# Patient Record
Sex: Female | Born: 2000 | Race: Black or African American | Hispanic: No | Marital: Single | Smoking: Never smoker
Health system: Southern US, Community
[De-identification: ages and names within clinical notes are randomized; demographics above are authoritative.]

## PROBLEM LIST (undated history)

## (undated) ENCOUNTER — Inpatient Hospital Stay (HOSPITAL_COMMUNITY): Payer: Self-pay

## (undated) DIAGNOSIS — R072 Precordial pain: Secondary | ICD-10-CM

## (undated) DIAGNOSIS — F419 Anxiety disorder, unspecified: Secondary | ICD-10-CM

## (undated) DIAGNOSIS — R161 Splenomegaly, not elsewhere classified: Secondary | ICD-10-CM

## (undated) DIAGNOSIS — F32A Depression, unspecified: Secondary | ICD-10-CM

## (undated) DIAGNOSIS — B2790 Infectious mononucleosis, unspecified without complication: Secondary | ICD-10-CM

## (undated) DIAGNOSIS — F329 Major depressive disorder, single episode, unspecified: Secondary | ICD-10-CM

## (undated) HISTORY — PX: NO PAST SURGERIES: SHX2092

---
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
## 2020-01-22 ENCOUNTER — Other Ambulatory Visit: Payer: Self-pay | Admitting: Advanced Practice Midwife

## 2020-01-24 ENCOUNTER — Encounter (INDEPENDENT_AMBULATORY_CARE_PROVIDER_SITE_OTHER): Payer: Self-pay | Admitting: Neurology

## 2020-01-24 ENCOUNTER — Other Ambulatory Visit: Payer: Self-pay

## 2020-01-24 ENCOUNTER — Ambulatory Visit (INDEPENDENT_AMBULATORY_CARE_PROVIDER_SITE_OTHER): Payer: Medicaid Other | Admitting: Neurology

## 2020-01-24 VITALS — BP 118/78 | HR 80 | Ht <= 58 in | Wt 71.2 lb

## 2020-01-24 DIAGNOSIS — G44209 Tension-type headache, unspecified, not intractable: Secondary | ICD-10-CM

## 2020-01-24 DIAGNOSIS — F411 Generalized anxiety disorder: Secondary | ICD-10-CM | POA: Diagnosis not present

## 2020-01-24 DIAGNOSIS — G43009 Migraine without aura, not intractable, without status migrainosus: Secondary | ICD-10-CM | POA: Diagnosis not present

## 2020-01-24 MED ORDER — AMITRIPTYLINE HCL 25 MG PO TABS: ORAL_TABLET | ORAL | 5 refills | Status: DC

## 2020-01-24 NOTE — Progress Notes (Signed)
Patient: Jamie Moyer MRN: [id number] Sex: female DOB: [birth year]  Provider: Keturah Shavers, MD Location of Care: [facility]  Note type: Routine return visit  Referral Source: Ivory Broad, MD History from: patient, [facility] chart and mom Chief Complaint: headache  History of Present Illness: Jamie Moyer is a 19 y.o. female is here for follow-up management of headache.  She has diagnosis of migraine and tension type headaches as well as anxiety issues and some sensory symptoms of extremities for which she has been on low-dose amitriptyline with fairly good headache control.  She was last seen in April when she was pregnant and now she has her baby and since delivery the dose of amitriptyline increased from 12.5 mg every night to 25 mg every night. Over the past couple of months she has been doing very well in terms of headache intensity and frequency and she did not have more than 1 headache each month needed OTC medications. She usually sleeps well without any difficulty and she has not had any other issues such as vomiting or dizziness and overall doing well without any other complaints or concerns.  Review of Systems: Review of system as per HPI, otherwise negative.  Past Medical History:  Diagnosis Date  . Anxiety   . Depression   . Mononucleosis   . Precordial catch syndrome   . Spleen enlargement    Hospitalizations: No., Head Injury: No., Nervous System Infections: No., Immunizations up to date: Yes.     Surgical History Past Surgical History:  Procedure Laterality Date  . CESAREAN SECTION N/A 09/05/2019   Procedure: CESAREAN SECTION;  Surgeon: Allie Bossier, MD;  Location: [facility];  Service: Obstetrics;  Laterality: N/A;  . LAPAROSCOPIC APPENDECTOMY N/A 12/24/2019   Procedure: APPENDECTOMY LAPAROSCOPIC;  Surgeon: Axel Filler, MD;  Location: [facility] OR;  Service: General;  Laterality: N/A;  . NO PAST SURGERIES      Family History family history  includes Diabetes in an other family member; Healthy in her father; Heart disease in an other family member; High blood pressure in her mother; Migraines in her mother; Panic disorder in her mother.   Social History Social History   Socioeconomic History  . Marital status: Single    Spouse name: Not on file  . Number of children: Not on file  . Years of education: Not on file  . Highest education level: High school graduate  Occupational History  . Occupation: Corporate investment banker  Tobacco Use  . Smoking status: Passive Smoke Exposure - Never Smoker  . Smokeless tobacco: Never Used  Vaping Use  . Vaping Use: Never used  Substance and Sexual Activity  . Alcohol use: No  . Drug use: No  . Sexual activity: Not Currently    Birth control/protection: None  Other Topics Concern  . 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   Lives with her mother. She has adult siblings that do no live in the home.    Social Determinants of Health   Financial Resource Strain: Unknown  . Difficulty of Paying Living Expenses: Patient refused  Food Insecurity: Unknown  . Worried About Programme researcher, broadcasting/film/video in the Last Year: Patient refused  . Ran Out of Food in the Last Year: Patient refused  Transportation Needs: Unknown  . Lack of Transportation (Medical): Patient refused  . Lack of Transportation (Non-Medical): Patient refused  Physical Activity: Unknown  .  Days of Exercise per Week: Patient refused  . Minutes of Exercise per Session: Patient refused  Stress: Unknown  . Feeling of Stress : Patient refused  Social Connections: Unknown  . Frequency of Communication with Friends and Family: Patient refused  . Frequency of Social Gatherings with Friends and Family: Patient refused  . Attends Religious Services: Patient refused  . Active Member of Clubs or Organizations: Patient refused  . Attends Banker Meetings: Patient refused   . Marital Status: Patient refused     Allergies  Allergen Reactions  . Other     Seasonal Allergies    . Cephalexin Rash    Physical Exam BP 118/78   Pulse 80   Ht 4' 9.87" (1.47 m)   Wt 71 lb 3.3 oz (32.3 kg)   BMI 14.95 kg/m  Gen: Awake, alert, not in distress, Non-toxic appearance. Skin: No neurocutaneous stigmata, no rash HEENT: Normocephalic, no dysmorphic features, no conjunctival injection, nares patent, mucous membranes moist, oropharynx clear. Neck: Supple, no meningismus, no lymphadenopathy,  Resp: Clear to auscultation bilaterally CV: Regular rate, normal S1/S2, no murmurs, no rubs Abd: Bowel sounds present, abdomen soft, non-tender, non-distended.  No hepatosplenomegaly or mass. Ext: Warm and well-perfused. No deformity, no muscle wasting, ROM full.  Neurological Examination: MS- Awake, alert, interactive Cranial Nerves- Pupils equal, round and reactive to light (5 to 82mm); fix and follows with full and smooth EOM; no nystagmus; no ptosis, funduscopy with normal sharp discs, visual field full by looking at the toys on the side, face symmetric with smile.  Hearing intact to bell bilaterally, palate elevation is symmetric, and tongue protrusion is symmetric. Tone- Normal Strength-Seems to have good strength, symmetrically by observation and passive movement. Reflexes-    Biceps Triceps Brachioradialis Patellar Ankle  R 2+ 2+ 2+ 2+ 2+  L 2+ 2+ 2+ 2+ 2+   Plantar responses flexor bilaterally, no clonus noted Sensation- Withdraw at four limbs to stimuli. Coordination- Reached to the object with no dysmetria Gait: Normal walk without any coordination or balance issues.   Assessment and Plan 1. Migraine without aura and without status migrainosus, not intractable   2. Anxiety state   3. Tension headache    This is a 19 year old female with diagnosis of migraine and tension type headaches as well has some anxiety issues, recently her baby girl and doing well  after without having any significant headaches and tolerating medication well with no side effects. Recommend to continue the same dose of amitriptyline at 25 mg every night which would help with headache, anxiety issues and sleep through the night. She will continue with more hydration with adequate sleep and limited screen time. She also needs to continue with dietary supplements and vitamins since she is significantly underweight and not eating well. She will call my office if she develops more frequent headaches otherwise I would like to see her in 6 months for follow-up visit and adjusting the dose of medication if needed.  She and her mother understood and agreed with the plan.   Meds ordered this encounter  Medications  . amitriptyline (ELAVIL) 25 MG tablet    Sig: Take 1 tablet nightly    Dispense:  30 tablet    Refill:  5

## 2020-01-24 NOTE — Patient Instructions (Signed)
Continue the same dose of amitriptyline every night Continue drinking more water Continue taking dietary supplements and multivitamin Have adequate sleep Return in 6 months for follow-up visit

## 2020-03-11 ENCOUNTER — Other Ambulatory Visit: Payer: Self-pay

## 2020-03-11 ENCOUNTER — Ambulatory Visit (HOSPITAL_COMMUNITY)
Admission: EM | Admit: 2020-03-11 | Discharge: 2020-03-11 | Disposition: A | Discharge status: Home / Self Care | Payer: Medicaid Other | Attending: Family Medicine | Admitting: Family Medicine

## 2020-03-11 ENCOUNTER — Encounter (HOSPITAL_COMMUNITY): Payer: Self-pay

## 2020-03-11 DIAGNOSIS — Z20822 Contact with and (suspected) exposure to covid-19: Secondary | ICD-10-CM | POA: Insufficient documentation

## 2020-03-11 LAB — SARS CORONAVIRUS 2 (TAT 6-24 HRS): SARS Coronavirus 2: POSITIVE — AB

## 2020-03-11 MED ORDER — BENZONATATE 100 MG PO CAPS
100.0000 mg | ORAL_CAPSULE | Freq: Three times a day (TID) | ORAL | 0 refills | Status: DC
Start: 2020-03-11 — End: 2020-09-12

## 2020-03-11 MED ORDER — CEPACOL SORE THROAT 5.4 MG MT LOZG
1.0000 | LOZENGE | OROMUCOSAL | 0 refills | Status: DC | PRN
Start: 2020-03-11 — End: 2020-09-12

## 2020-03-11 NOTE — ED Provider Notes (Signed)
[facility]    CSN: [id number] Arrival date & time: 03/11/20  1120      History   Chief Complaint Chief Complaint  Patient presents with  . Cough    HPI Jamie Moyer is a 19 y.o. female.   Patient reports for Covid testing due to close Covid exposure and developing symptoms of fatigue, slight cough and scratchy sore throat.  Her 56-month-old tested positive for Covid on eight twenty-two.  Few days prior to her 61-month-old testing positive patient had a slight scratchy throat that had mostly resolved.  Over the last few days she has developed a slight nonproductive cough and some general fatigue.  Denies fevers and chills.  Denies chest pain or shortness of breath.  Has been eating and drinking well.  No headaches.  No abdominal pain.  No nausea, vomiting or diarrhea.  She has been drinking warm teas to help with her cough.  She is not breast-feeding the infant.     Past Medical History:  Diagnosis Date  . Anxiety   . Depression   . Mononucleosis   . Precordial catch syndrome   . Spleen enlargement     Patient Active Problem List   Diagnosis Date Noted  . Appendicitis 12/24/2019  . Fetal heart rate decelerations affecting management of mother 09/04/2019  . Marginal insertion of umbilical cord affecting management of mother in third trimester 09/04/2019  . Gestational hypertension, third trimester 08/30/2019  . Episode of hypertension 07/25/2019  . Generalized anxiety disorder 04/25/2019  . Supervision of normal first pregnancy, antepartum 03/20/2019  . Tension headache 11/17/2015  . Anxiety state 11/17/2015  . Migraine without aura and without status migrainosus, not intractable 11/17/2015  . Poor appetite 11/17/2015  . Insomnia 11/17/2015    Past Surgical History:  Procedure Laterality Date  . CESAREAN SECTION N/A 09/05/2019   Procedure: CESAREAN SECTION;  Surgeon: Allie Bossier, MD;  Location: [facility];  Service: Obstetrics;  Laterality: N/A;  .  LAPAROSCOPIC APPENDECTOMY N/A 12/24/2019   Procedure: APPENDECTOMY LAPAROSCOPIC;  Surgeon: Axel Filler, MD;  Location: [facility] OR;  Service: General;  Laterality: N/A;  . NO PAST SURGERIES      OB History    Gravida  1   Para  1   Term      Preterm  1   AB      Living  1     SAB      TAB      Ectopic      Multiple  0   Live Births  1            Home Medications    Prior to Admission medications   Medication Sig Start Date End Date Taking? Authorizing Provider  acetaminophen (TYLENOL) 325 MG tablet Take 2 tablets (650 mg total) by mouth every 6 (six) hours as needed for mild pain (or temp > 100). 12/24/19   Maczis, Elmer Sow, PA-C  amitriptyline (ELAVIL) 25 MG tablet Take 1 tablet nightly 01/24/20   Keturah Shavers, MD  benzonatate (TESSALON) 100 MG capsule Take 1 capsule (100 mg total) by mouth every 8 (eight) hours. 03/11/20   Jerame Hedding, Veryl Speak, PA-C  busPIRone (BUSPAR) 5 MG tablet Take 2.5 mg by mouth daily. 07/30/19   [provider]  Ferrous Fumarate (HEMOCYTE - 106 MG FE) 324 (106 Fe) MG TABS tablet Take 1 tablet (106 mg of iron total) by mouth every other day. 09/08/19   Raelyn Mora, CNM  fluticasone (FLONASE) 50 MCG/ACT nasal spray Place 1 spray into both nostrils daily as needed for allergies.  11/07/15   [provider]  hydrOXYzine (ATARAX/VISTARIL) 10 MG tablet take 1 tablet by mouth twice a day if needed FOR ACUTE ANXIETY 07/28/16   [provider]  ibuprofen (ADVIL) 800 MG tablet Take 1 tablet (800 mg total) by mouth every 8 (eight) hours. 09/08/19   Raelyn Mora, CNM  loratadine (CLARITIN) 10 MG tablet Take 10 mg by mouth daily as needed for allergies.  11/07/15   [provider]  Menthol (CEPACOL SORE THROAT) 5.4 MG LOZG Use as directed 1 lozenge (5.4 mg total) in the mouth or throat every 2 (two) hours as needed. 03/11/20   Richmond Coldren, Veryl Speak, PA-C  Olopatadine HCl 0.2 % SOLN Place 1 drop into both eyes daily as needed (For  allergies).  09/11/19   [provider]  ondansetron (ZOFRAN) 4 MG tablet Take 4 mg by mouth every 6 (six) hours as needed. 12/28/19   [provider]  oxyCODONE (OXY IR/ROXICODONE) 5 MG immediate release tablet Take 1 tablet (5 mg total) by mouth every 6 (six) hours as needed for severe pain. 12/24/19   Maczis, Elmer Sow, PA-C  Prenatal MV & Min w/FA-DHA (PRENATAL ADULT GUMMY/DHA/FA) 0.4-25 MG CHEW Chew 1 Units by mouth daily. Patient not taking: Reported on 12/23/2019 07/25/19   Gerrit Heck, CNM  vitamin C (VITAMIN C) 500 MG tablet Take 1 tablet (500 mg total) by mouth every other day. 09/08/19   Raelyn Mora, CNM  amLODipine (NORVASC) 5 MG tablet Take 1 tablet (5 mg total) by mouth daily. 09/20/19 12/20/19  Raelyn Mora, CNM    Family History Family History  Problem Relation Age of Onset  . Migraines Mother   . Panic disorder Mother   . High blood pressure Mother   . Healthy Father   . Diabetes Other   . Heart disease Other     Social History Social History   Tobacco Use  . Smoking status: Passive Smoke Exposure - Never Smoker  . Smokeless tobacco: Never Used  Vaping Use  . Vaping Use: Never used  Substance Use Topics  . Alcohol use: No  . Drug use: No     Allergies   Other and Cephalexin   Review of Systems Review of Systems   Physical Exam Triage Vital Signs ED Triage Vitals  Enc Vitals Group     BP 03/11/20 1341 92/64     Pulse Rate 03/11/20 1341 (!) 117     Resp 03/11/20 1341 16     Temp 03/11/20 1341 98.1 F (36.7 C)     Temp Source 03/11/20 1341 Oral     SpO2 03/11/20 1341 99 %     Weight --      Height --      Head Circumference --      Peak Flow --      Pain Score 03/11/20 1340 0     Pain Loc --      Pain Edu? --      Excl. in GC? --    No data found.  Updated Vital Signs BP 92/64 (BP Location: Right Arm)   Pulse (!) 117   Temp 98.1 F (36.7 C) (Oral)   Resp 16   LMP 03/11/2020   SpO2 99%   Breastfeeding No   Visual  Acuity Right Eye Distance:   Left Eye Distance:   Bilateral Distance:    Right  Eye Near:   Left Eye Near:    Bilateral Near:     Physical Exam Vitals and nursing note reviewed.  Constitutional:      General: She is not in acute distress.    Appearance: She is well-developed. She is not ill-appearing.  HENT:     Head: Normocephalic and atraumatic.     Nose: Congestion present.  Eyes:     Conjunctiva/sclera: Conjunctivae normal.  Cardiovascular:     Rate and Rhythm: Regular rhythm. Tachycardia present.     Heart sounds: No murmur heard.   Pulmonary:     Effort: Pulmonary effort is normal. No respiratory distress.     Breath sounds: Normal breath sounds. No wheezing, rhonchi or rales.  Abdominal:     Palpations: Abdomen is soft.     Tenderness: There is no abdominal tenderness.  Musculoskeletal:     Cervical back: Neck supple.  Skin:    General: Skin is warm and dry.  Neurological:     Mental Status: She is alert.      UC Treatments / Results  Labs (all labs ordered are listed, but only abnormal results are displayed) Labs Reviewed  SARS CORONAVIRUS 2 (TAT 6-24 HRS) - Abnormal; Notable for the following components:      Result Value   SARS Coronavirus 2 POSITIVE (*)    All other components within normal limits    EKG   Radiology No results found.  Procedures Procedures (including critical care time)  Medications Ordered in UC Medications - No data to display  Initial Impression / Assessment and Plan / UC Course  I have reviewed the triage vital signs and the nursing notes.  Pertinent labs & imaging results that were available during my care of the patient were reviewed by me and considered in my medical decision making (see chart for details).     #Suspected COVID-19 virus #Close exposure to Covid Patient is a 19 year old presenting with what is most likely COVID-19 driven upper respiratory symptoms.  Slightly elevated heart rate, otherwise  reassuring and benign exam.  Well-appearing in clinic.  Covid sent.  Will treat symptomatically.  Return, follow-up and emergency for precautions discussed.  Patient verbalized understanding plan of care. Final Clinical Impressions(s) / UC Diagnoses   Final diagnoses:  Suspected COVID-19 virus infection  Close exposure to COVID-19 virus     Discharge Instructions     I am fairly certain that you likely have Covid at this point given close exposures.  Take medications as prescribed  If severe symptoms such as shortness of breath, vomiting, high fevers return or go to the emergency department  If your Covid-19 test is positive, you will receive a phone call from [facility] regarding your results. Negative test results are not called. Both positive and negative results area always visible on MyChart. If you do not have a MyChart account, sign up instructions are in your discharge papers.   Persons who are directed to care for themselves at home may discontinue isolation under the following conditions:  . At least 10 days have passed since symptom onset and . At least 24 hours have passed without running a fever (this means without the use of fever-reducing medications) and . Other symptoms have improved.  Persons infected with COVID-19 who never develop symptoms may discontinue isolation and other precautions 10 days after the date of their first positive COVID-19 test.       ED Prescriptions    Medication Sig Dispense Auth. Provider  benzonatate (TESSALON) 100 MG capsule Take 1 capsule (100 mg total) by mouth every 8 (eight) hours. 21 capsule Jadin Creque, Veryl SpeakJacob E, PA-C   Menthol (CEPACOL SORE THROAT) 5.4 MG LOZG Use as directed 1 lozenge (5.4 mg total) in the mouth or throat every 2 (two) hours as needed. 30 lozenge Kross Swallows, Veryl SpeakJacob E, PA-C     PDMP not reviewed this encounter.   Hermelinda Medicusarr, Lorali Khamis E, PA-C 03/12/20 [phone]

## 2020-03-11 NOTE — ED Triage Notes (Signed)
Pt is here with a cough that started 3 days ago, pt has drunk tea to relieve discomfort. Pt's daughter tested POSITIVE for COVID 8/22.

## 2020-03-11 NOTE — Discharge Instructions (Signed)
I am fairly certain that you likely have Covid at this point given close exposures.  Take medications as prescribed  If severe symptoms such as shortness of breath, vomiting, high fevers return or go to the emergency department  If your Covid-19 test is positive, you will receive a phone call from [facility] regarding your results. Negative test results are not called. Both positive and negative results area always visible on MyChart. If you do not have a MyChart account, sign up instructions are in your discharge papers.   Persons who are directed to care for themselves at home may discontinue isolation under the following conditions:   At least 10 days have passed since symptom onset and  At least 24 hours have passed without running a fever (this means without the use of fever-reducing medications) and  Other symptoms have improved.  Persons infected with COVID-19 who never develop symptoms may discontinue isolation and other precautions 10 days after the date of their first positive COVID-19 test.

## 2020-03-12 ENCOUNTER — Telehealth: Payer: Self-pay | Admitting: Emergency Medicine

## 2020-03-12 NOTE — Telephone Encounter (Signed)
Spoke with pt given test results; verbalized understanding of positive covid screening

## 2020-04-30 ENCOUNTER — Other Ambulatory Visit (INDEPENDENT_AMBULATORY_CARE_PROVIDER_SITE_OTHER): Payer: Self-pay

## 2020-05-04 ENCOUNTER — Encounter (HOSPITAL_COMMUNITY): Payer: Self-pay | Admitting: Emergency Medicine

## 2020-05-04 ENCOUNTER — Ambulatory Visit (HOSPITAL_COMMUNITY)
Admission: EM | Admit: 2020-05-04 | Discharge: 2020-05-04 | Disposition: A | Discharge status: Home / Self Care | Payer: Medicaid Other | Attending: Emergency Medicine | Admitting: Emergency Medicine

## 2020-05-04 DIAGNOSIS — R300 Dysuria: Secondary | ICD-10-CM

## 2020-05-04 DIAGNOSIS — M549 Dorsalgia, unspecified: Secondary | ICD-10-CM

## 2020-05-04 DIAGNOSIS — R000 Tachycardia, unspecified: Secondary | ICD-10-CM | POA: Diagnosis present

## 2020-05-04 DIAGNOSIS — R109 Unspecified abdominal pain: Secondary | ICD-10-CM | POA: Diagnosis present

## 2020-05-04 DIAGNOSIS — Z3202 Encounter for pregnancy test, result negative: Secondary | ICD-10-CM | POA: Diagnosis not present

## 2020-05-04 LAB — POCT URINALYSIS DIPSTICK, ED / UC
Bilirubin Urine: NEGATIVE
Glucose, UA: NEGATIVE mg/dL
Ketones, ur: NEGATIVE mg/dL
Nitrite: NEGATIVE
Protein, ur: NEGATIVE mg/dL
Specific Gravity, Urine: 1.015 (ref 1.005–1.030)
Urobilinogen, UA: 0.2 mg/dL (ref 0.0–1.0)
pH: 7 (ref 5.0–8.0)

## 2020-05-04 LAB — CBC WITH DIFFERENTIAL/PLATELET
Abs Immature Granulocytes: 0.02 10*3/uL (ref 0.00–0.07)
Basophils Absolute: 0.1 10*3/uL (ref 0.0–0.1)
Basophils Relative: 1 %
Eosinophils Absolute: 0 10*3/uL (ref 0.0–0.5)
Eosinophils Relative: 0 %
HCT: 41.7 % (ref 36.0–46.0)
Hemoglobin: 14.1 g/dL (ref 12.0–15.0)
Immature Granulocytes: 0 %
Lymphocytes Relative: 26 %
Lymphs Abs: 2.1 10*3/uL (ref 0.7–4.0)
MCH: 29.6 pg (ref 26.0–34.0)
MCHC: 33.8 g/dL (ref 30.0–36.0)
MCV: 87.4 fL (ref 80.0–100.0)
Monocytes Absolute: 0.5 10*3/uL (ref 0.1–1.0)
Monocytes Relative: 7 %
Neutro Abs: 5.3 10*3/uL (ref 1.7–7.7)
Neutrophils Relative %: 66 %
Platelets: 294 10*3/uL (ref 150–400)
RBC: 4.77 MIL/uL (ref 3.87–5.11)
RDW: 11.5 % (ref 11.5–15.5)
WBC: 8 10*3/uL (ref 4.0–10.5)
nRBC: 0 % (ref 0.0–0.2)

## 2020-05-04 LAB — COMPREHENSIVE METABOLIC PANEL
ALT: 16 U/L (ref 0–44)
AST: 16 U/L (ref 15–41)
Albumin: 4.2 g/dL (ref 3.5–5.0)
Alkaline Phosphatase: 70 U/L (ref 38–126)
Anion gap: 10 (ref 5–15)
BUN: 11 mg/dL (ref 6–20)
CO2: 22 mmol/L (ref 22–32)
Calcium: 9.4 mg/dL (ref 8.9–10.3)
Chloride: 104 mmol/L (ref 98–111)
Creatinine, Ser: 0.61 mg/dL (ref 0.44–1.00)
GFR, Estimated: >60 mL/min (ref >60)
Glucose, Bld: 91 mg/dL (ref 70–99)
Potassium: 3.9 mmol/L (ref 3.5–5.1)
Sodium: 136 mmol/L (ref 135–145)
Total Bilirubin: 1.1 mg/dL (ref 0.3–1.2)
Total Protein: 7.7 g/dL (ref 6.5–8.1)

## 2020-05-04 LAB — LIPASE, BLOOD: Lipase: 28 U/L (ref 11–51)

## 2020-05-04 LAB — POC URINE PREG, ED: Preg Test, Ur: NEGATIVE

## 2020-05-04 MED ORDER — IBUPROFEN 800 MG PO TABS: 800.0000 mg | ORAL_TABLET | Freq: Three times a day (TID) | ORAL | 0 refills | Status: DC | PRN

## 2020-05-04 MED ORDER — NITROFURANTOIN MONOHYD MACRO 100 MG PO CAPS: 100.0000 mg | ORAL_CAPSULE | Freq: Two times a day (BID) | ORAL | 0 refills | Status: DC

## 2020-05-04 MED ORDER — SULFAMETHOXAZOLE-TRIMETHOPRIM 800-160 MG PO TABS: 1.0000 | ORAL_TABLET | Freq: Two times a day (BID) | ORAL | 0 refills | Status: AC

## 2020-05-04 MED ORDER — KETOROLAC TROMETHAMINE 60 MG/2ML IM SOLN
60.0000 mg | Freq: Once | INTRAMUSCULAR | Status: AC
  Administered 2020-05-04: 60 mg via INTRAMUSCULAR

## 2020-05-04 MED ORDER — KETOROLAC TROMETHAMINE 60 MG/2ML IM SOLN
INTRAMUSCULAR | Status: AC
  Filled 2020-05-04: qty 2

## 2020-05-04 NOTE — ED Triage Notes (Signed)
Pt presents with low back/ side pain, dysuria, and clowdy urine xs 3-4 days.

## 2020-05-04 NOTE — ED Provider Notes (Signed)
[facility]    CSN: [id number] Arrival date & time: 05/04/20  1503      History   Chief Complaint Chief Complaint  Patient presents with  . Back Pain  . Dysuria    HPI Jamie Moyer is a 19 y.o. female.   Jamie Moyer presents with complaints of urinary symptoms. Right side pain. Pain with urination. Urine is cloudy. Symptoms started 3-4 days ago. No known fevers. Urinary frequecy. Some urgency. No vaginal symptoms. Some back pain. No history of kidney stones. Was treated for UTI in April, negative culture at that time, but felt better after antibtioics for UTI- macrobid. . Had her appendix out in June of this year. Not menstruating. Hasn't seen blood to urine.     ROS per HPI, negative if not otherwise mentioned.      Past Medical History:  Diagnosis Date  . Anxiety   . Depression   . Mononucleosis   . Precordial catch syndrome   . Spleen enlargement     Patient Active Problem List   Diagnosis Date Noted  . Appendicitis 12/24/2019  . Fetal heart rate decelerations affecting management of mother 09/04/2019  . Marginal insertion of umbilical cord affecting management of mother in third trimester 09/04/2019  . Gestational hypertension, third trimester 08/30/2019  . Episode of hypertension 07/25/2019  . Generalized anxiety disorder 04/25/2019  . Supervision of normal first pregnancy, antepartum 03/20/2019  . Tension headache 11/17/2015  . Anxiety state 11/17/2015  . Migraine without aura and without status migrainosus, not intractable 11/17/2015  . Poor appetite 11/17/2015  . Insomnia 11/17/2015    Past Surgical History:  Procedure Laterality Date  . CESAREAN SECTION N/A 09/05/2019   Procedure: CESAREAN SECTION;  Surgeon: Allie Bossier, MD;  Location: [facility];  Service: Obstetrics;  Laterality: N/A;  . LAPAROSCOPIC APPENDECTOMY N/A 12/24/2019   Procedure: APPENDECTOMY LAPAROSCOPIC;  Surgeon: Axel Filler, MD;  Location: [facility];  Service:  General;  Laterality: N/A;  . NO PAST SURGERIES      OB History    Gravida  1   Para  1   Term      Preterm  1   AB      Living  1     SAB      TAB      Ectopic      Multiple  0   Live Births  1            Home Medications    Prior to Admission medications   Medication Sig Start Date End Date Taking? Authorizing Provider  acetaminophen (TYLENOL) 325 MG tablet Take 2 tablets (650 mg total) by mouth every 6 (six) hours as needed for mild pain (or temp > 100). 12/24/19   Maczis, Elmer Sow, PA-C  amitriptyline (ELAVIL) 25 MG tablet Take 1 tablet nightly 01/24/20   Keturah Shavers, MD  benzonatate (TESSALON) 100 MG capsule Take 1 capsule (100 mg total) by mouth every 8 (eight) hours. 03/11/20   Darr, Veryl Speak, PA-C  busPIRone (BUSPAR) 5 MG tablet Take 2.5 mg by mouth daily. 07/30/19   [provider]  Ferrous Fumarate (HEMOCYTE - 106 MG FE) 324 (106 Fe) MG TABS tablet Take 1 tablet (106 mg of iron total) by mouth every other day. 09/08/19   Raelyn Mora, CNM  fluticasone (FLONASE) 50 MCG/ACT nasal spray Place 1 spray into both nostrils daily as needed for allergies.  11/07/15   [provider]  hydrOXYzine (ATARAX/VISTARIL) 10 MG tablet take 1 tablet by mouth twice a day if needed FOR ACUTE ANXIETY 07/28/16   [provider]  ibuprofen (ADVIL) 800 MG tablet Take 1 tablet (800 mg total) by mouth every 8 (eight) hours as needed. 05/04/20   Georgetta HaberBurky, Leoni Goodness B, NP  loratadine (CLARITIN) 10 MG tablet Take 10 mg by mouth daily as needed for allergies.  11/07/15   [provider]  Menthol (CEPACOL SORE THROAT) 5.4 MG LOZG Use as directed 1 lozenge (5.4 mg total) in the mouth or throat every 2 (two) hours as needed. 03/11/20   Darr, Veryl SpeakJacob E, PA-C  Olopatadine HCl 0.2 % SOLN Place 1 drop into both eyes daily as needed (For allergies).  09/11/19   [provider]  ondansetron (ZOFRAN) 4 MG tablet Take 4 mg by mouth every 6 (six) hours as needed.  12/28/19   [provider]  oxyCODONE (OXY IR/ROXICODONE) 5 MG immediate release tablet Take 1 tablet (5 mg total) by mouth every 6 (six) hours as needed for severe pain. 12/24/19   Maczis, Elmer SowMichael M, PA-C  Prenatal MV & Min w/FA-DHA (PRENATAL ADULT GUMMY/DHA/FA) 0.4-25 MG CHEW Chew 1 Units by mouth daily. Patient not taking: Reported on 12/23/2019 07/25/19   Gerrit HeckEmly, Jessica, CNM  sulfamethoxazole-trimethoprim (BACTRIM DS) 800-160 MG tablet Take 1 tablet by mouth 2 (two) times daily for 10 days. 05/04/20 05/14/20  Georgetta HaberBurky, Tyree Vandruff B, NP  vitamin C (VITAMIN C) 500 MG tablet Take 1 tablet (500 mg total) by mouth every other day. 09/08/19   Raelyn Moraawson, Rolitta, CNM  amLODipine (NORVASC) 5 MG tablet Take 1 tablet (5 mg total) by mouth daily. 09/20/19 12/20/19  Raelyn Moraawson, Rolitta, CNM    Family History Family History  Problem Relation Age of Onset  . Migraines Mother   . Panic disorder Mother   . High blood pressure Mother   . Healthy Father   . Diabetes Other   . Heart disease Other     Social History Social History   Tobacco Use  . Smoking status: Passive Smoke Exposure - Never Smoker  . Smokeless tobacco: Never Used  Vaping Use  . Vaping Use: Never used  Substance Use Topics  . Alcohol use: No  . Drug use: No     Allergies   Other and Cephalexin   Review of Systems Review of Systems   Physical Exam Triage Vital Signs ED Triage Vitals  Enc Vitals Group     BP 05/04/20 1534 (!) 138/95     Pulse Rate 05/04/20 1534 (!) 121     Resp 05/04/20 1534 16     Temp 05/04/20 1534 (!) 97.2 F (36.2 C)     Temp Source 05/04/20 1534 Oral     SpO2 05/04/20 1534 99 %     Weight --      Height --      Head Circumference --      Peak Flow --      Pain Score 05/04/20 1533 6     Pain Loc --      Pain Edu? --      Excl. in GC? --    No data found.  Updated Vital Signs BP (!) 138/95 (BP Location: Right Arm)   Pulse (!) 121   Temp (!) 97.2 F (36.2 C) (Oral)   Resp 16   LMP 04/14/2020    SpO2 99%   Visual Acuity Right Eye Distance:   Left Eye Distance:   Bilateral Distance:  Right Eye Near:   Left Eye Near:    Bilateral Near:     Physical Exam Constitutional:      General: She is not in acute distress.    Appearance: She is well-developed.  Cardiovascular:     Rate and Rhythm: Tachycardia present.     Comments: Tachycardia worsens with examination with apparent discomfort  Pulmonary:     Effort: Pulmonary effort is normal.  Abdominal:     Tenderness: There is abdominal tenderness in the right upper quadrant. There is right CVA tenderness.     Comments: Right flank and RUQ abdominal tenderness   Skin:    General: Skin is warm and dry.  Neurological:     Mental Status: She is alert and oriented to person, place, and time.    With exam HR up to 140.   UC Treatments / Results  Labs (all labs ordered are listed, but only abnormal results are displayed) Labs Reviewed  POCT URINALYSIS DIPSTICK, ED / UC - Abnormal; Notable for the following components:      Result Value   Hgb urine dipstick MODERATE (*)    Leukocytes,Ua LARGE (*)    All other components within normal limits  URINE CULTURE  CBC WITH DIFFERENTIAL/PLATELET  COMPREHENSIVE METABOLIC PANEL  LIPASE, BLOOD  POC URINE PREG, ED    EKG   Radiology No results found.  Procedures Procedures (including critical care time)  Medications Ordered in UC Medications  ketorolac (TORADOL) injection 60 mg (60 mg Intramuscular Given 05/04/20 1657)    Initial Impression / Assessment and Plan / UC Course  I have reviewed the triage vital signs and the nursing notes.  Pertinent labs & imaging results that were available during my care of the patient were reviewed by me and considered in my medical decision making (see chart for details).    Patient with tachycardia, she expresses she gets anxious and heart rate elevates. On chart review with last episode of heart rate this hight, up to 140, she had  an appendicitis. Flank pain, leukos and hgb to urine. Kidney stone vs pyelonephritis vs uti vs cholecystitis all considered. Patient declines IV fluids to see if this helps with HR. Pain mildly improved with toradol in clinic. Antibiotics initiated at this time pending urine culture with strict return precautions. Patient verbalized understanding and agreeable to plan. Ambulatory out of clinic without difficulty.   Final Clinical Impressions(s) / UC Diagnoses   Final diagnoses:  Flank pain  Tachycardia     Discharge Instructions     Ibuprofen every 8 hours as needed for pain.  Please start bactrim, antibiotics, pending lab results of urine culture and blood testing.  We will call you if there are any concerning findings with your blood testing.  If no improvement in the next 48 hours, or if worsening please go to the ER.     ED Prescriptions    Medication Sig Dispense Auth. Provider   nitrofurantoin, macrocrystal-monohydrate, (MACROBID) 100 MG capsule  (Status: Discontinued) Take 1 capsule (100 mg total) by mouth 2 (two) times daily for 5 days. 10 capsule Linus Mako B, NP   sulfamethoxazole-trimethoprim (BACTRIM DS) 800-160 MG tablet Take 1 tablet by mouth 2 (two) times daily for 10 days. 20 tablet Linus Mako B, NP   ibuprofen (ADVIL) 800 MG tablet Take 1 tablet (800 mg total) by mouth every 8 (eight) hours as needed. 30 tablet Georgetta Haber, NP     PDMP not reviewed this encounter.  Georgetta Haber, NP 05/05/20 1539

## 2020-05-04 NOTE — Discharge Instructions (Addendum)
Ibuprofen every 8 hours as needed for pain.  Please start bactrim, antibiotics, pending lab results of urine culture and blood testing.  We will call you if there are any concerning findings with your blood testing.  If no improvement in the next 48 hours, or if worsening please go to the ER.

## 2020-05-08 LAB — URINE CULTURE: Culture: >=100000 — AB

## 2020-06-13 ENCOUNTER — Other Ambulatory Visit: Payer: Self-pay

## 2020-06-13 ENCOUNTER — Ambulatory Visit
Admission: EM | Admit: 2020-06-13 | Discharge: 2020-06-13 | Disposition: A | Discharge status: Home / Self Care | Payer: Medicaid Other | Attending: Emergency Medicine | Admitting: Emergency Medicine

## 2020-06-13 DIAGNOSIS — J029 Acute pharyngitis, unspecified: Secondary | ICD-10-CM | POA: Diagnosis not present

## 2020-06-13 LAB — POCT RAPID STREP A (OFFICE): Rapid Strep A Screen: NEGATIVE

## 2020-06-13 NOTE — ED Triage Notes (Signed)
Pt present sore throat with abdominal pain. Pt states symptoms started two days ago. Pt states her throat is red and the back and dry.

## 2020-06-13 NOTE — Discharge Instructions (Signed)

## 2020-06-13 NOTE — ED Provider Notes (Signed)
[facility]    CSN: [id number] Arrival date & time: 06/13/20  1147      History   Chief Complaint Chief Complaint  Patient presents with  . Abdominal Pain  . Sore Throat    HPI CAROLEEN STOERMER is a 19 y.o. female  History was provided by the patient. JO-ANN JOHANNING is a 19 y.o. female who presents for evaluation of a sore throat. Associated symptoms include post nasal drip and sore throat. Onset of symptoms was 2 days ago, unchanged since that time.  She is drinking plenty of fluids. She has not had recent close exposure to someone with proven streptococcal pharyngitis. The following portions of the patient's history were reviewed and updated as appropriate: allergies, current medications, past family history, past medical history, past social history, past surgical history and problem list.     Past Medical History:  Diagnosis Date  . Anxiety   . Depression   . Mononucleosis   . Precordial catch syndrome   . Spleen enlargement     Patient Active Problem List   Diagnosis Date Noted  . Appendicitis 12/24/2019  . Fetal heart rate decelerations affecting management of mother 09/04/2019  . Marginal insertion of umbilical cord affecting management of mother in third trimester 09/04/2019  . Gestational hypertension, third trimester 08/30/2019  . Episode of hypertension 07/25/2019  . Generalized anxiety disorder 04/25/2019  . Supervision of normal first pregnancy, antepartum 03/20/2019  . Tension headache 11/17/2015  . Anxiety state 11/17/2015  . Migraine without aura and without status migrainosus, not intractable 11/17/2015  . Poor appetite 11/17/2015  . Insomnia 11/17/2015    Past Surgical History:  Procedure Laterality Date  . CESAREAN SECTION N/A 09/05/2019   Procedure: CESAREAN SECTION;  Surgeon: Allie Bossier, MD;  Location: [facility]S;  Service: Obstetrics;  Laterality: N/A;  . LAPAROSCOPIC APPENDECTOMY N/A 12/24/2019   Procedure: APPENDECTOMY  LAPAROSCOPIC;  Surgeon: Axel Filler, MD;  Location: [facility] OR;  Service: General;  Laterality: N/A;  . NO PAST SURGERIES      OB History    Gravida  1   Para  1   Term      Preterm  1   AB      Living  1     SAB      TAB      Ectopic      Multiple  0   Live Births  1            Home Medications    Prior to Admission medications   Medication Sig Start Date End Date Taking? Authorizing Provider  acetaminophen (TYLENOL) 325 MG tablet Take 2 tablets (650 mg total) by mouth every 6 (six) hours as needed for mild pain (or temp > 100). 12/24/19   Maczis, Elmer Sow, PA-C  amitriptyline (ELAVIL) 25 MG tablet Take 1 tablet nightly 01/24/20   Keturah Shavers, MD  benzonatate (TESSALON) 100 MG capsule Take 1 capsule (100 mg total) by mouth every 8 (eight) hours. 03/11/20   Darr, Gerilyn Pilgrim, PA-C  busPIRone (BUSPAR) 5 MG tablet Take 2.5 mg by mouth daily. 07/30/19   [provider]  Ferrous Fumarate (HEMOCYTE - 106 MG FE) 324 (106 Fe) MG TABS tablet Take 1 tablet (106 mg of iron total) by mouth every other day. 09/08/19   Raelyn Mora, CNM  fluticasone (FLONASE) 50 MCG/ACT nasal spray Place 1 spray into both nostrils daily as needed for allergies.  11/07/15   [provider]  hydrOXYzine (ATARAX/VISTARIL) 10 MG tablet take 1 tablet by mouth twice a day if needed FOR ACUTE ANXIETY 07/28/16   [provider]  ibuprofen (ADVIL) 800 MG tablet Take 1 tablet (800 mg total) by mouth every 8 (eight) hours as needed. 05/04/20   Georgetta Haber, NP  loratadine (CLARITIN) 10 MG tablet Take 10 mg by mouth daily as needed for allergies.  11/07/15   [provider]  Menthol (CEPACOL SORE THROAT) 5.4 MG LOZG Use as directed 1 lozenge (5.4 mg total) in the mouth or throat every 2 (two) hours as needed. 03/11/20   Darr, Gerilyn Pilgrim, PA-C  Olopatadine HCl 0.2 % SOLN Place 1 drop into both eyes daily as needed (For allergies).  09/11/19   [provider]  ondansetron  (ZOFRAN) 4 MG tablet Take 4 mg by mouth every 6 (six) hours as needed. 12/28/19   [provider]  oxyCODONE (OXY IR/ROXICODONE) 5 MG immediate release tablet Take 1 tablet (5 mg total) by mouth every 6 (six) hours as needed for severe pain. 12/24/19   Maczis, Elmer Sow, PA-C  Prenatal MV & Min w/FA-DHA (PRENATAL ADULT GUMMY/DHA/FA) 0.4-25 MG CHEW Chew 1 Units by mouth daily. Patient not taking: Reported on 12/23/2019 07/25/19   Gerrit Heck, CNM  vitamin C (VITAMIN C) 500 MG tablet Take 1 tablet (500 mg total) by mouth every other day. 09/08/19   Raelyn Mora, CNM  amLODipine (NORVASC) 5 MG tablet Take 1 tablet (5 mg total) by mouth daily. 09/20/19 12/20/19  Raelyn Mora, CNM    Family History Family History  Problem Relation Age of Onset  . Migraines Mother   . Panic disorder Mother   . High blood pressure Mother   . Healthy Father   . Diabetes Other   . Heart disease Other     Social History Social History   Tobacco Use  . Smoking status: Passive Smoke Exposure - Never Smoker  . Smokeless tobacco: Never Used  Vaping Use  . Vaping Use: Never used  Substance Use Topics  . Alcohol use: No  . Drug use: No     Allergies   Other and Cephalexin   Review of Systems Review of Systems  Constitutional: Negative for fatigue and fever.  HENT: Positive for sore throat. Negative for congestion, dental problem, ear pain, facial swelling, hearing loss, sinus pain, trouble swallowing and voice change.   Eyes: Negative for photophobia, pain and visual disturbance.  Respiratory: Negative for cough and shortness of breath.   Cardiovascular: Negative for chest pain and palpitations.  Gastrointestinal: Positive for abdominal pain. Negative for diarrhea and vomiting.  Musculoskeletal: Negative for arthralgias and myalgias.  Neurological: Negative for dizziness and headaches.     Physical Exam Triage Vital Signs ED Triage Vitals  Enc Vitals Group     BP 06/13/20 1201 127/86      Pulse Rate 06/13/20 1201 (!) 121     Resp 06/13/20 1201 16     Temp 06/13/20 1201 98.7 F (37.1 C)     Temp Source 06/13/20 1201 Oral     SpO2 06/13/20 1201 98 %     Weight --      Height --      Head Circumference --      Peak Flow --      Pain Score 06/13/20 1202 3     Pain Loc --      Pain Edu? --      Excl. in GC? --  No data found.  Updated Vital Signs BP 127/86 (BP Location: Left Arm)   Pulse (!) 121   Temp 98.7 F (37.1 C) (Oral)   Resp 16   LMP 06/13/2020   SpO2 98%   Visual Acuity Right Eye Distance:   Left Eye Distance:   Bilateral Distance:    Right Eye Near:   Left Eye Near:    Bilateral Near:     Physical Exam Constitutional:      General: She is not in acute distress.    Appearance: She is not ill-appearing or diaphoretic.  HENT:     Head: Normocephalic and atraumatic.     Right Ear: Tympanic membrane and ear canal normal.     Left Ear: Tympanic membrane and ear canal normal.     Mouth/Throat:     Mouth: Mucous membranes are moist.     Pharynx: Oropharynx is clear. Uvula midline. Posterior oropharyngeal erythema present. No oropharyngeal exudate or uvula swelling.     Tonsils: No tonsillar exudate. 2+ on the right. 2+ on the left.  Eyes:     General: No scleral icterus.    Conjunctiva/sclera: Conjunctivae normal.     Pupils: Pupils are equal, round, and reactive to light.  Neck:     Comments: Trachea midline, negative JVD Cardiovascular:     Rate and Rhythm: Normal rate and regular rhythm.     Heart sounds: No murmur heard.  No gallop.   Pulmonary:     Effort: Pulmonary effort is normal. No respiratory distress.     Breath sounds: No wheezing, rhonchi or rales.  Musculoskeletal:     Cervical back: Neck supple. No tenderness.  Lymphadenopathy:     Cervical: No cervical adenopathy.  Skin:    Capillary Refill: Capillary refill takes less than 2 seconds.     Coloration: Skin is not jaundiced or pale.     Findings: No rash.  Neurological:      General: No focal deficit present.     Mental Status: She is alert and oriented to person, place, and time.      UC Treatments / Results  Labs (all labs ordered are listed, but only abnormal results are displayed) Labs Reviewed  CULTURE, GROUP A STREP [facility]  POCT RAPID STREP A (OFFICE)    EKG   Radiology No results found.  Procedures Procedures (including critical care time)  Medications Ordered in UC Medications - No data to display  Initial Impression / Assessment and Plan / UC Course  I have reviewed the triage vital signs and the nursing notes.  Pertinent labs & imaging results that were available during my care of the patient were reviewed by me and considered in my medical decision making (see chart for details).     Rapid strep negative, culture pending.  Patient declines Covid testing.  Had acute COVID-19 infection in August 2021.  We will treat for viral pharyngitis as below.  Return precautions discussed, pt verbalized understanding and is agreeable to plan. Final Clinical Impressions(s) / UC Diagnoses   Final diagnoses:  Sore throat     Discharge Instructions     Your rapid strep test was negative today.  The culture is pending.  Please look on your MyChart for test results.   We will notify you if the culture positive and outline a treatment plan at that time.   Please continue Tylenol and/or Ibuprofen as needed for fever, pain.  May try warm salt water gargles, cepacol lozenges, throat spray,  warm tea or water with lemon/honey, or OTC cold relief medicine for throat discomfort.   For congestion: take a daily anti-histamine like Zyrtec, Claritin, and a oral decongestant to help with post nasal drip that may be irritating your throat.   It is important to stay hydrated: drink plenty of fluids (primarily water) to keep your throat moisturized and help further relieve irritation/discomfort.     ED Prescriptions    None     PDMP not reviewed  this encounter.   Hall[place], [place], [place] 06/13/20 1315

## 2020-06-19 LAB — CULTURE, GROUP A STREP (THRC)

## 2020-07-28 ENCOUNTER — Other Ambulatory Visit: Payer: Self-pay

## 2020-07-28 ENCOUNTER — Other Ambulatory Visit (INDEPENDENT_AMBULATORY_CARE_PROVIDER_SITE_OTHER): Payer: Self-pay

## 2020-07-28 ENCOUNTER — Encounter (INDEPENDENT_AMBULATORY_CARE_PROVIDER_SITE_OTHER): Payer: Self-pay | Admitting: Neurology

## 2020-07-28 ENCOUNTER — Ambulatory Visit (INDEPENDENT_AMBULATORY_CARE_PROVIDER_SITE_OTHER): Payer: Medicaid Other | Admitting: Neurology

## 2020-07-28 VITALS — BP 110/72 | HR 76 | Ht 58.27 in | Wt 75.0 lb

## 2020-07-28 DIAGNOSIS — G44209 Tension-type headache, unspecified, not intractable: Secondary | ICD-10-CM

## 2020-07-28 DIAGNOSIS — G43009 Migraine without aura, not intractable, without status migrainosus: Secondary | ICD-10-CM | POA: Diagnosis not present

## 2020-07-28 DIAGNOSIS — F411 Generalized anxiety disorder: Secondary | ICD-10-CM

## 2020-07-28 DIAGNOSIS — R251 Tremor, unspecified: Secondary | ICD-10-CM

## 2020-07-28 LAB — T3, FREE: T3, Free: 3.6 pg/mL (ref 3.0–4.7)

## 2020-07-28 LAB — TSH: TSH: 1.36 mIU/L

## 2020-07-28 LAB — T4, FREE: Free T4: 1.1 ng/dL (ref 0.8–1.4)

## 2020-07-28 MED ORDER — AMITRIPTYLINE HCL 25 MG PO TABS: ORAL_TABLET | ORAL | 5 refills | Status: DC

## 2020-07-28 NOTE — Progress Notes (Signed)
Patient: Jamie Moyer MRN: [id number] Sex: female DOB: Oct 09, 2000  Provider: Keturah Shavers, MD Location of Care: [facility]  Note type: Routine return visit  Referral Source: Ivory Broad, MD History from: patient, [facility] chart and mom Chief Complaint: Headaches less frequent but more intense  History of Present Illness: Jamie Moyer is a 20 y.o. female is here for follow-up management of headache.  She has diagnosis of chronic migraine and tension type headaches with some anxiety issues over the past few years with fairly good control on moderate dose of amitriptyline at 25 mg every night. She was last seen in July and over the past few months she has been having on average 1 or 2 headaches each month which is significantly less frequent compared to before but the headaches are usually severe and would be 9-10 out of 10 although she would not have any vomiting with the headaches. She usually sleeps for without any difficulty and with no awakening headache.  She denies having any specific stress or anxiety issues since her pregnancy and her baby is around 1 year of age now although she does have some shaking and tremor of the extremities and some degree of nervousness that would happen off and on and also she has not gained significant weight over the past year although her appetite is very good.  She had her TSH checked a few times which were in the lower side of normal range.   Review of Systems: Review of system as per HPI, otherwise negative.  Past Medical History:  Diagnosis Date  . Anxiety   . Depression   . Mononucleosis   . Precordial catch syndrome   . Spleen enlargement    Hospitalizations: No., Head Injury: No., Nervous System Infections: No., Immunizations up to date: Yes.     Surgical History Past Surgical History:  Procedure Laterality Date  . CESAREAN SECTION N/A 09/05/2019   Procedure: CESAREAN SECTION;  Surgeon: Allie Bossier, MD;  Location: [facility] OR[facility];  Service: Obstetrics;  Laterality: N/A;  . LAPAROSCOPIC APPENDECTOMY N/A 12/24/2019   Procedure: APPENDECTOMY LAPAROSCOPIC;  Surgeon: Axel Filler, MD;  Location: [facility] OR;  Service: General;  Laterality: N/A;  . NO PAST SURGERIES      Family History family history includes Diabetes in an other family member; Healthy in her father; Heart disease in an other family member; High blood pressure in her mother; Migraines in her mother; Panic disorder in her mother.  Social History Social History   Socioeconomic History  . Marital status: Single    Spouse name: Not on file  . Number of children: Not on file  . Years of education: Not on file  . Highest education level: High school graduate  Occupational History  . Occupation: Corporate investment banker  Tobacco Use  . Smoking status: Passive Smoke Exposure - Never Smoker  . Smokeless tobacco: Never Used  Vaping Use  . Vaping Use: Never used  Substance and Sexual Activity  . Alcohol use: No  . Drug use: No  . Sexual activity: Yes    Birth control/protection: None  Other Topics Concern  . 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   Lives with her mother. She has adult siblings that do no live in the home.    Social Determinants of Health   Financial Resource Strain: Not on file  Food Insecurity: Not on file  Transportation  Needs: Not on file  Physical Activity: Not on file  Stress: Not on file  Social Connections: Not on file     Allergies  Allergen Reactions  . Other     Seasonal Allergies    . Cephalexin Rash    Physical Exam BP 110/72   Pulse 76   Ht 4' 10.27" (1.48 m)   Wt 74 lb 15.3 oz (34 kg)   BMI 15.52 kg/m  Gen: Awake, alert, not in distress Skin: No rash, No neurocutaneous stigmata. HEENT: Normocephalic, no dysmorphic features, no conjunctival injection, nares patent, mucous membranes moist, oropharynx clear. Neck: Supple, no meningismus. No focal  tenderness. Resp: Clear to auscultation bilaterally CV: Regular rate, normal S1/S2, no murmurs, no rubs Abd: BS present, abdomen soft, non-tender, non-distended. No hepatosplenomegaly or mass Ext: Warm and well-perfused. No deformities, no muscle wasting, ROM full.  Neurological Examination: MS: Awake, alert, interactive. Normal eye contact, answered the questions appropriately, speech was fluent,  Normal comprehension.  Attention and concentration were normal. Cranial Nerves: Pupils were equal and reactive to light ( 5-56mm);  normal fundoscopic exam with sharp discs, visual field full with confrontation test; EOM normal, no nystagmus; no ptsosis, no double vision, intact facial sensation, face symmetric with full strength of facial muscles, hearing intact to finger rub bilaterally, palate elevation is symmetric, tongue protrusion is symmetric with full movement to both sides.  Sternocleidomastoid and trapezius are with normal strength. Tone-Normal Strength-Normal strength in all muscle groups DTRs-  Biceps Triceps Brachioradialis Patellar Ankle  R 2+ 2+ 2+ 2+ 2+  L 2+ 2+ 2+ 2+ 2+   Plantar responses flexor bilaterally, no clonus noted Sensation: Intact to light touch,  Romberg negative. Coordination: No dysmetria on FTN test. No difficulty with balance.  She does have slight tremor on the stretched arms. Gait: Normal walk and run. Tandem gait was normal. Was able to perform toe walking and heel walking without difficulty.  Assessment and Plan 1. Migraine without aura and without status migrainosus, not intractable   2. Anxiety state   3. Tension headache   4. Tremor    This is a 20 year old female with episodes of migraine and tension type headaches with some anxiety issues as well has episodes of tremor and shaking of extremities which have been happening off and on.  Her headaches have been significantly better although occasional headaches that she would have would be very severe.  She  has no focal findings on her neurological examination. Recommend to continue the same dose of amitriptyline at 25 mg every night. I would like to check her TSH as well as T3 and T4 for possibility of hyperthyroidism. She needs to continue with appropriate hydration and sleep and limited screen time She will continue making headache diary.  She also needs to continue taking dietary supplements. If her thyroid function test is normal and she continues with more tremor, I may switch her medication from amitriptyline to low-dose propranolol. I would like to see her in 6 months for follow-up visit or sooner if she develops more frequent symptoms.  She and her mother understood and agreed with the plan.   Meds ordered this encounter  Medications  . amitriptyline (ELAVIL) 25 MG tablet    Sig: Take 1 tablet nightly    Dispense:  30 tablet    Refill:  5   Orders Placed This Encounter  Procedures  . TSH+T4F+T3Free

## 2020-09-04 ENCOUNTER — Ambulatory Visit: Payer: Medicaid Other | Admitting: Obstetrics and Gynecology

## 2020-09-12 ENCOUNTER — Encounter: Payer: Self-pay | Admitting: Advanced Practice Midwife

## 2020-09-12 ENCOUNTER — Other Ambulatory Visit: Payer: Self-pay

## 2020-09-12 ENCOUNTER — Ambulatory Visit (INDEPENDENT_AMBULATORY_CARE_PROVIDER_SITE_OTHER): Payer: Medicaid Other | Admitting: Advanced Practice Midwife

## 2020-09-12 ENCOUNTER — Other Ambulatory Visit (HOSPITAL_COMMUNITY)
Admission: RE | Admit: 2020-09-12 | Discharge: 2020-09-12 | Disposition: A | Discharge status: Home / Self Care | Payer: Medicaid Other | Source: Ambulatory Visit | Attending: Obstetrics and Gynecology | Admitting: Obstetrics and Gynecology

## 2020-09-12 VITALS — BP 132/74 | HR 119 | Temp 97.7°F | Ht <= 58 in | Wt 77.6 lb

## 2020-09-12 DIAGNOSIS — Z30011 Encounter for initial prescription of contraceptive pills: Secondary | ICD-10-CM | POA: Diagnosis not present

## 2020-09-12 DIAGNOSIS — Z113 Encounter for screening for infections with a predominantly sexual mode of transmission: Secondary | ICD-10-CM | POA: Insufficient documentation

## 2020-09-12 DIAGNOSIS — Z0000 Encounter for general adult medical examination without abnormal findings: Secondary | ICD-10-CM

## 2020-09-12 LAB — POCT URINE PREGNANCY: Preg Test, Ur: NEGATIVE

## 2020-09-12 MED ORDER — LEVONORGESTREL-ETHINYL ESTRAD 0.1-20 MG-MCG PO TABS: 1.0000 | ORAL_TABLET | Freq: Every day | ORAL | 11 refills | Status: DC

## 2020-09-12 NOTE — Progress Notes (Signed)
GYNECOLOGY ANNUAL PREVENTATIVE CARE ENCOUNTER NOTE  Subjective:   Jamie Moyer is a 20 y.o. G45P0101 female here for a routine annual gynecologic exam.  Current complaints: none.   Denies abnormal vaginal bleeding, discharge, pelvic pain, problems with intercourse or other gynecologic concerns.   Patient would like to STD testing and to start birth control today.    Gynecologic History Patient's last menstrual period was 09/08/2020 (exact date). Contraception: none Last Pap: NA, age.  Last mammogram: NA, age  Obstetric History OB History  Gravida Para Term Preterm AB Living  1 1   1   1   SAB IAB Ectopic Multiple Live Births        0 1    # Outcome Date GA Lbr Len/2nd Weight Sex Delivery Anes PTL Lv  1 Preterm 09/06/19 [redacted]w[redacted]d  5 lb 1.5 oz (2.31 kg) F CS-LVertical Spinal  LIV    Past Medical History:  Diagnosis Date  . Anxiety   . Depression   . Mononucleosis   . Precordial catch syndrome   . Spleen enlargement     Past Surgical History:  Procedure Laterality Date  . CESAREAN SECTION N/A 09/05/2019   Procedure: CESAREAN SECTION;  Surgeon: 09/07/2019, MD;  Location: [facility]S;  Service: Obstetrics;  Laterality: N/A;  . LAPAROSCOPIC APPENDECTOMY N/A 12/24/2019   Procedure: APPENDECTOMY LAPAROSCOPIC;  Surgeon: 02/23/2020, MD;  Location: [facility] OR;  Service: General;  Laterality: N/A;  . NO PAST SURGERIES      Current Outpatient Medications on File Prior to Visit  Medication Sig Dispense Refill  . acetaminophen (TYLENOL) 325 MG tablet Take 2 tablets (650 mg total) by mouth every 6 (six) hours as needed for mild pain (or temp > 100).    [facility] amitriptyline (ELAVIL) 25 MG tablet Take 1 tablet nightly 30 tablet 5  . busPIRone (BUSPAR) 5 MG tablet Take 2.5 mg by mouth daily.    . Ferrous Fumarate (HEMOCYTE - 106 MG FE) 324 (106 Fe) MG TABS tablet Take 1 tablet (106 mg of iron total) by mouth every other day. 30 tablet 3  . fluticasone (FLONASE) 50 MCG/ACT nasal spray Place 1  spray into both nostrils daily as needed for allergies.   1  . hydrOXYzine (ATARAX/VISTARIL) 10 MG tablet take 1 tablet by mouth twice a day if needed FOR ACUTE ANXIETY  0  . ibuprofen (ADVIL) 800 MG tablet Take 1 tablet (800 mg total) by mouth every 8 (eight) hours as needed. 30 tablet 0  . loratadine (CLARITIN) 10 MG tablet Take 10 mg by mouth daily as needed for allergies.   1  . Olopatadine HCl 0.2 % SOLN Place 1 drop into both eyes daily as needed (For allergies).     . ondansetron (ZOFRAN) 4 MG tablet Take 4 mg by mouth every 6 (six) hours as needed.    . vitamin C (VITAMIN C) 500 MG tablet Take 1 tablet (500 mg total) by mouth every other day. 30 tablet 3  . [DISCONTINUED] amLODipine (NORVASC) 5 MG tablet Take 1 tablet (5 mg total) by mouth daily. 30 tablet 3   No current facility-administered medications on file prior to visit.    Allergies  Allergen Reactions  . Other     Seasonal Allergies    . Cephalexin Rash    Social History   Socioeconomic History  . Marital status: Single    Spouse name: Not on file  . Number of children: Not on file  .  Years of education: Not on file  . Highest education level: High school graduate  Occupational History  . Occupation: Corporate investment banker  Tobacco Use  . Smoking status: Passive Smoke Exposure - Never Smoker  . Smokeless tobacco: Never Used  Vaping Use  . Vaping Use: Never used  Substance and Sexual Activity  . Alcohol use: No  . Drug use: No  . Sexual activity: Yes    Birth control/protection: None  Other Topics Concern  . 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   Lives with her mother. She has adult siblings that do no live in the home.    Social Determinants of Health   Financial Resource Strain: Not on file  Food Insecurity: Not on file  Transportation Needs: Not on file  Physical Activity: Not on file  Stress: Not on file  Social Connections: Not on file   Intimate Partner Violence: Not on file    Family History  Problem Relation Age of Onset  . Migraines Mother   . Panic disorder Mother   . High blood pressure Mother   . Healthy Father   . Diabetes Other   . Heart disease Other     The following portions of the patient's history were reviewed and updated as appropriate: allergies, current medications, past family history, past medical history, past social history, past surgical history and problem list.  Review of Systems Pertinent items noted in HPI and remainder of comprehensive ROS otherwise negative.   Objective:  BP 132/74 (BP Location: Left Arm, Patient Position: Sitting, Cuff Size: Small)   Pulse (!) 119   Temp 97.7 F (36.5 C) (Oral)   Ht 4\' 10"  (1.473 m)   Wt 77 lb 9.6 oz (35.2 kg)   LMP 09/08/2020 (Exact Date)   Breastfeeding No   BMI 16.22 kg/m  CONSTITUTIONAL: Well-developed, well-nourished female in no acute distress.  HENT:  Normocephalic, atraumatic, External right and left ear normal. Oropharynx is clear and moist EYES: Conjunctivae and EOM are normal. Pupils are equal, round, and reactive to light. No scleral icterus.  NECK: Normal range of motion, supple, no masses.  Normal thyroid.  SKIN: Skin is warm and dry. No rash noted. Not diaphoretic. No erythema. No pallor. NEUROLOGIC: Alert and oriented to person, place, and time. Normal reflexes, muscle tone coordination. No cranial nerve deficit noted. PSYCHIATRIC: Normal mood and affect. Normal behavior. Normal judgment and thought content. CARDIOVASCULAR: Normal heart rate noted, regular rhythm RESPIRATORY: Clear to auscultation bilaterally. Effort and breath sounds normal, no problems with respiration noted. ABDOMEN: Soft, normal bowel sounds, no distention noted.  No tenderness, rebound or guarding.  Pap smear obtained.  Normal uterine size, no other palpable masses, no uterine or adnexal tenderness. MUSCULOSKELETAL: Normal range of motion. No tenderness.  No  cyanosis, clubbing, or edema.  2+ distal pulses.   Reviewed with the patient all forms of birth control options available including abstinence; over the counter/barrier methods; hormonal contraceptive medication including pill, patch, ring, Depo-Provera injection, Nexplanon; Mirena/Liletta and Paragard IUDs; permanent sterilization options including tubal ligation. Risks and benefits reviewed.  Questions were answered.  Information was given to patient to review.   Patient would like to start with OCPs today    Results for orders placed or performed in visit on 09/12/20 (from the past 24 hour(s))  POCT urine pregnancy     Status: Normal   Collection Time: 09/12/20 11:37 AM  Result Value Ref Range   Preg Test, Ur Negative Negative     Assessment and Plan:  1. Screen for STD (sexually transmitted disease) - HIV Antibody (routine testing w rflx) - RPR - Urine cytology ancillary only()  2. OCP (oral contraceptive pills) initiation - POCT urine pregnancy  3. Encounter for annual general medical examination without abnormal findings in adult    Routine preventative health maintenance measures emphasized. Please refer to After Visit Summary for other counseling recommendations.    Thressa Sheller DNP, CNM  09/12/20  11:32 AM

## 2020-09-13 LAB — HIV ANTIBODY (ROUTINE TESTING W REFLEX): HIV Screen 4th Generation wRfx: NONREACTIVE

## 2020-09-13 LAB — RPR: RPR Ser Ql: NONREACTIVE

## 2020-09-17 LAB — URINE CYTOLOGY ANCILLARY ONLY
Chlamydia: POSITIVE — AB
Comment: NEGATIVE
Comment: NEGATIVE
Comment: NORMAL
Neisseria Gonorrhea: POSITIVE — AB
Trichomonas: NEGATIVE

## 2020-09-18 ENCOUNTER — Other Ambulatory Visit: Payer: Self-pay

## 2020-09-18 ENCOUNTER — Ambulatory Visit (INDEPENDENT_AMBULATORY_CARE_PROVIDER_SITE_OTHER): Payer: Medicaid Other | Admitting: *Deleted

## 2020-09-18 ENCOUNTER — Other Ambulatory Visit: Payer: Self-pay | Admitting: *Deleted

## 2020-09-18 VITALS — BP 142/99 | HR 117 | Temp 98.2°F | Ht <= 58 in | Wt 75.4 lb

## 2020-09-18 DIAGNOSIS — A549 Gonococcal infection, unspecified: Secondary | ICD-10-CM

## 2020-09-18 DIAGNOSIS — A749 Chlamydial infection, unspecified: Secondary | ICD-10-CM

## 2020-09-18 MED ORDER — DOXYCYCLINE HYCLATE 100 MG PO TABS: 100.0000 mg | ORAL_TABLET | Freq: Two times a day (BID) | ORAL | 0 refills | Status: AC

## 2020-09-18 NOTE — Progress Notes (Signed)
   S: Patient in nurse clinic today for STD treatment of Gonorrhea and Chlamydia.    O: Need for treatment of Gonorrhea and Chlamydia.  A: Rx for doxycycline 100 mg 1 tab PO BID x 7 days and Ceftriaxone 500 mg IM x 1 not given due to allergy of Cephalexin.   P: Patient to follow up in 1 month for re-screening.   Patient could not wait for [facility] to bring Gentamicin to clinic. Patient reported she will go to [facility] for treatment. The health department treated her in the past. Copy of test results given. Advised patient to call clinic in about 1-2 months. STD report form fax completed and faxed to [facility] Department at  [phone] (STD department).     Patient advised to abstain from sex for 7-10 days after treatment or when partner has been tested/treated.    Clovis Pu, RN

## 2020-09-25 ENCOUNTER — Other Ambulatory Visit: Payer: Self-pay | Admitting: *Deleted

## 2020-09-25 DIAGNOSIS — N898 Other specified noninflammatory disorders of vagina: Secondary | ICD-10-CM

## 2020-09-25 MED ORDER — FLUCONAZOLE 150 MG PO TABS: 150.0000 mg | ORAL_TABLET | Freq: Once | ORAL | 0 refills | Status: DC

## 2020-09-25 NOTE — Progress Notes (Signed)
Patient called requesting medication for yeast infection. She finished taking the antibiotic for STIs. Diflucan 150 mg 1 tablet by PO x 1 sent to pharmacy.  Clovis Pu, RN

## 2020-10-21 ENCOUNTER — Telehealth: Payer: Self-pay | Admitting: *Deleted

## 2020-10-21 NOTE — Telephone Encounter (Signed)
Patient called reporting headaches with birth control pills. Patient stated she has history of migraines. Patient wanted to know what to take for the headaches. Advised patient to try Tylenol 500 mg extra strength every 6 hours and Ibuprofen 800 mg every 8 hours. If medication does not help with headache, to call clinic for an appointment to discuss other options of birth control.   Clovis Pu, RN

## 2020-11-11 ENCOUNTER — Other Ambulatory Visit (HOSPITAL_COMMUNITY)
Admission: RE | Admit: 2020-11-11 | Discharge: 2020-11-11 | Disposition: A | Discharge status: Home / Self Care | Payer: Medicaid Other | Source: Ambulatory Visit | Attending: Obstetrics and Gynecology | Admitting: Obstetrics and Gynecology

## 2020-11-11 ENCOUNTER — Other Ambulatory Visit: Payer: Self-pay

## 2020-11-11 ENCOUNTER — Ambulatory Visit (INDEPENDENT_AMBULATORY_CARE_PROVIDER_SITE_OTHER): Payer: Medicaid Other | Admitting: *Deleted

## 2020-11-11 VITALS — BP 144/96 | HR 98 | Temp 97.9°F | Ht <= 58 in | Wt 78.4 lb

## 2020-11-11 DIAGNOSIS — Z113 Encounter for screening for infections with a predominantly sexual mode of transmission: Secondary | ICD-10-CM | POA: Diagnosis present

## 2020-11-11 NOTE — Progress Notes (Signed)
   SUBJECTIVE:  20 y.o. female in clinic for test of cure from +GC/Chlamydia on 09/12/20. Denies abnormal vaginal bleeding or significant pelvic pain or fever. No UTI symptoms. Denies history of known exposure to STD.  No LMP recorded.  OBJECTIVE:  She appears well, afebrile. Urine dipstick: not done.  ASSESSMENT:  Test of cure from +GC/Chlamdyia on 09/12/20.   PLAN:  GC, chlamydia, trichomonas urine culture sent to lab. Treatment: To be determined once lab results are received Patient to follow up with PCP regarding elevated BP in office today.  Clovis Pu, RN

## 2020-11-12 LAB — URINE CYTOLOGY ANCILLARY ONLY
Chlamydia: NEGATIVE
Comment: NEGATIVE
Comment: NEGATIVE
Comment: NORMAL
Neisseria Gonorrhea: NEGATIVE
Trichomonas: NEGATIVE

## 2020-11-14 ENCOUNTER — Other Ambulatory Visit: Payer: Self-pay | Admitting: Obstetrics and Gynecology

## 2020-11-14 DIAGNOSIS — N898 Other specified noninflammatory disorders of vagina: Secondary | ICD-10-CM

## 2020-11-15 ENCOUNTER — Other Ambulatory Visit: Payer: Self-pay

## 2020-11-15 ENCOUNTER — Ambulatory Visit (HOSPITAL_COMMUNITY)
Admission: EM | Admit: 2020-11-15 | Discharge: 2020-11-15 | Disposition: A | Discharge status: Home / Self Care | Payer: Medicaid Other | Attending: Medical Oncology | Admitting: Medical Oncology

## 2020-11-15 ENCOUNTER — Encounter (HOSPITAL_COMMUNITY): Payer: Self-pay | Admitting: Medical Oncology

## 2020-11-15 DIAGNOSIS — R5383 Other fatigue: Secondary | ICD-10-CM | POA: Diagnosis present

## 2020-11-15 DIAGNOSIS — Z7722 Contact with and (suspected) exposure to environmental tobacco smoke (acute) (chronic): Secondary | ICD-10-CM | POA: Insufficient documentation

## 2020-11-15 DIAGNOSIS — U071 COVID-19: Secondary | ICD-10-CM | POA: Insufficient documentation

## 2020-11-15 DIAGNOSIS — Z79899 Other long term (current) drug therapy: Secondary | ICD-10-CM | POA: Insufficient documentation

## 2020-11-15 DIAGNOSIS — R059 Cough, unspecified: Secondary | ICD-10-CM | POA: Diagnosis not present

## 2020-11-15 DIAGNOSIS — Z793 Long term (current) use of hormonal contraceptives: Secondary | ICD-10-CM | POA: Insufficient documentation

## 2020-11-15 DIAGNOSIS — J029 Acute pharyngitis, unspecified: Secondary | ICD-10-CM | POA: Insufficient documentation

## 2020-11-15 MED ORDER — FLUTICASONE PROPIONATE 50 MCG/ACT NA SUSP: 2.0000 | Freq: Every day | NASAL | 0 refills | Status: DC

## 2020-11-15 MED ORDER — BENZONATATE 100 MG PO CAPS: 100.0000 mg | ORAL_CAPSULE | Freq: Three times a day (TID) | ORAL | 0 refills | Status: DC

## 2020-11-15 NOTE — ED Provider Notes (Signed)
[facility]    CSN: [id number] Arrival date & time: 11/15/20  1517      History   Chief Complaint Chief Complaint  Patient presents with  . Sore Throat  . Fatigue    HPI Jamie Moyer is a 20 y.o. female.   HPI   Sore Throat: Patient reports that she has had a sore throat, cough and fatigue since last night.  She took a home COVID test which was positive x 2.  She presents today for retesting to ensure that her home test was correct.  No known fevers, shortness of breath or chest pain. She has taken tylenol for symptoms.    Past Medical History:  Diagnosis Date  . Anxiety   . Depression   . Mononucleosis   . Precordial catch syndrome   . Spleen enlargement     Patient Active Problem List   Diagnosis Date Noted  . Appendicitis 12/24/2019  . Fetal heart rate decelerations affecting management of mother 09/04/2019  . Marginal insertion of umbilical cord affecting management of mother in third trimester 09/04/2019  . Gestational hypertension, third trimester 08/30/2019  . Episode of hypertension 07/25/2019  . Generalized anxiety disorder 04/25/2019  . Supervision of normal first pregnancy, antepartum 03/20/2019  . Tension headache 11/17/2015  . Anxiety state 11/17/2015  . Migraine without aura and without status migrainosus, not intractable 11/17/2015  . Poor appetite 11/17/2015  . Insomnia 11/17/2015    Past Surgical History:  Procedure Laterality Date  . CESAREAN SECTION N/A 09/05/2019   Procedure: CESAREAN SECTION;  Surgeon: Allie Bossier, MD;  Location: [facility];  Service: Obstetrics;  Laterality: N/A;  . LAPAROSCOPIC APPENDECTOMY N/A 12/24/2019   Procedure: APPENDECTOMY LAPAROSCOPIC;  Surgeon: Axel Filler, MD;  Location: [facility] OR;  Service: General;  Laterality: N/A;  . NO PAST SURGERIES      OB History    Gravida  1   Para  1   Term      Preterm  1   AB      Living  1     SAB      IAB      Ectopic      Multiple  0   Live  Births  1            Home Medications    Prior to Admission medications   Medication Sig Start Date End Date Taking? Authorizing Provider  benzonatate (TESSALON) 100 MG capsule Take 1 capsule (100 mg total) by mouth every 8 (eight) hours. 11/15/20  Yes Delmon Andrada M, PA-C  fluticasone [facility]) 50 MCG/ACT nasal spray Place 2 sprays into both nostrils daily. 11/15/20  Yes Koral Thaden M, PA-C  acetaminophen (TYLENOL) 325 MG tablet Take 2 tablets (650 mg total) by mouth every 6 (six) hours as needed for mild pain (or temp > 100). Patient not taking: Reported on 11/11/2020 12/24/19   Jacinto Halim, PA-C  amitriptyline (ELAVIL) 25 MG tablet Take 1 tablet nightly 07/28/20   Keturah Shavers, MD  busPIRone (BUSPAR) 5 MG tablet Take 2.5 mg by mouth daily. 07/30/19   [provider]  Ferrous Fumarate (HEMOCYTE - 106 MG FE) 324 (106 Fe) MG TABS tablet Take 1 tablet (106 mg of iron total) by mouth every other day. Patient not taking: Reported on 11/11/2020 09/08/19   Raelyn Mora, CNM  hydrOXYzine (ATARAX/VISTARIL) 10 MG tablet take 1 tablet by mouth twice a day if needed FOR ACUTE ANXIETY 07/28/16  [provider]  ibuprofen (ADVIL) 800 MG tablet Take 1 tablet (800 mg total) by mouth every 8 (eight) hours as needed. Patient not taking: Reported on 11/11/2020 05/04/20   Georgetta Haber, NP  levonorgestrel-ethinyl estradiol (ALESSE) 0.1-20 MG-MCG tablet Take 1 tablet by mouth daily. 09/12/20   Armando Reichert, CNM  loratadine (CLARITIN) 10 MG tablet Take 10 mg by mouth daily as needed for allergies.  11/07/15   [provider]  Olopatadine HCl 0.2 % SOLN Place 1 drop into both eyes daily as needed (For allergies).  Patient not taking: Reported on 11/11/2020 09/11/19   [provider]  ondansetron (ZOFRAN) 4 MG tablet Take 4 mg by mouth every 6 (six) hours as needed. Patient not taking: Reported on 11/11/2020 12/28/19   [provider]  vitamin C  (VITAMIN C) 500 MG tablet Take 1 tablet (500 mg total) by mouth every other day. Patient not taking: Reported on 11/11/2020 09/08/19   Raelyn Mora, CNM  amLODipine (NORVASC) 5 MG tablet Take 1 tablet (5 mg total) by mouth daily. 09/20/19 12/20/19  Raelyn Mora, CNM    Family History Family History  Problem Relation Age of Onset  . Migraines Mother   . Panic disorder Mother   . High blood pressure Mother   . Healthy Father   . Diabetes Other   . Heart disease Other     Social History Social History   Tobacco Use  . Smoking status: Passive Smoke Exposure - Never Smoker  . Smokeless tobacco: Never Used  Vaping Use  . Vaping Use: Never used  Substance Use Topics  . Alcohol use: No  . Drug use: No     Allergies   Other and Cephalexin   Review of Systems Review of Systems  As stated above in hPI Physical Exam Triage Vital Signs ED Triage Vitals  Enc Vitals Group     BP 11/15/20 1606 (!) 158/102     Pulse Rate 11/15/20 1606 (!) 112     Resp 11/15/20 1606 18     Temp 11/15/20 1606 98 F (36.7 C)     Temp Source 11/15/20 1606 Oral     SpO2 11/15/20 1606 100 %     Weight --      Height --      Head Circumference --      Peak Flow --      Pain Score 11/15/20 1607 0     Pain Loc --      Pain Edu? --      Excl. in GC? --    No data found.  Updated Vital Signs BP (!) 158/102 (BP Location: Right Arm)   Pulse (!) 112   Temp 98 F (36.7 C) (Oral)   Resp 18   SpO2 100%   Physical Exam Vitals and nursing note reviewed.  Constitutional:      General: She is not in acute distress.    Appearance: She is well-developed. She is not ill-appearing, toxic-appearing or diaphoretic.  HENT:     Head: Normocephalic and atraumatic.     Right Ear: Tympanic membrane and ear canal normal. No drainage, swelling or tenderness. No middle ear effusion. Tympanic membrane is not erythematous.     Left Ear: Tympanic membrane and ear canal normal. No drainage, swelling or  tenderness.  No middle ear effusion. Tympanic membrane is not erythematous.     Nose: Congestion and rhinorrhea present.     Mouth/Throat:  Mouth: Mucous membranes are moist. No oral lesions.     Pharynx: Oropharynx is clear. Uvula midline. No pharyngeal swelling, oropharyngeal exudate, posterior oropharyngeal erythema or uvula swelling.     Tonsils: No tonsillar exudate or tonsillar abscesses.  Cardiovascular:     Rate and Rhythm: Normal rate and regular rhythm.     Heart sounds: Normal heart sounds.  Pulmonary:     Effort: Pulmonary effort is normal.     Breath sounds: Normal breath sounds.  Lymphadenopathy:     Cervical: No cervical adenopathy.  Skin:    General: Skin is warm.  Neurological:     Mental Status: She is alert and oriented to person, place, and time.      UC Treatments / Results  Labs (all labs ordered are listed, but only abnormal results are displayed) Labs Reviewed  SARS CORONAVIRUS 2 (TAT 6-24 HRS)    EKG   Radiology No results found.  Procedures Procedures (including critical care time)  Medications Ordered in UC Medications - No data to display  Initial Impression / Assessment and Plan / UC Course  I have reviewed the triage vital signs and the nursing notes.  Pertinent labs & imaging results that were available during my care of the patient were reviewed by me and considered in my medical decision making (see chart for details).     New.  Discussed COVID testing along with COVID precautions.  Handout given.  Quarantine recommendation.  O2 monitoring recommended as well. Red flag signs and symptoms discussed.  Final Clinical Impressions(s) / UC Diagnoses   Final diagnoses:  Pharyngitis, unspecified etiology  Cough  Fatigue, unspecified type   Discharge Instructions   None    ED Prescriptions    Medication Sig Dispense Auth. Provider   fluticasone (FLONASE) 50 MCG/ACT nasal spray Place 2 sprays into both nostrils daily. 16 mL  Benedict Kue M, PA-C   benzonatate (TESSALON) 100 MG capsule Take 1 capsule (100 mg total) by mouth every 8 (eight) hours. 21 capsule Rushie Chestnut, [place]     PDMP not reviewed this encounter.   Rushie Chestnut, [place] 11/15/20 1644

## 2020-11-15 NOTE — ED Triage Notes (Signed)
Pt present sore throat, and cough with fatigue. Symptom started yesterday and pt took at home covid test and it was positive. She would like to be tested again for accurate test.

## 2020-11-16 LAB — SARS CORONAVIRUS 2 (TAT 6-24 HRS): SARS Coronavirus 2: POSITIVE — AB

## 2020-11-17 ENCOUNTER — Telehealth: Payer: Self-pay

## 2020-11-17 NOTE — Telephone Encounter (Signed)
Called to discuss with patient about COVID-19 symptoms and the use of one of the available treatments for those with mild to moderate Covid symptoms and at a high risk of hospitalization.  Pt appears to qualify for outpatient treatment due to co-morbid conditions and/or a member of an at-risk group in accordance with the FDA Emergency Use Authorization.    Symptom onset: 11/14/20 Vaccinated: uNKNOWN Booster? Unknown Immunocompromised? No Qualifiers:  NIH Criteria:   Unable to reach pt - Left message and call back number [phone].   Jamie Moyer

## 2020-11-18 ENCOUNTER — Ambulatory Visit: Payer: Medicaid Other

## 2020-12-22 ENCOUNTER — Encounter (INDEPENDENT_AMBULATORY_CARE_PROVIDER_SITE_OTHER): Payer: Self-pay

## 2020-12-22 ENCOUNTER — Telehealth (INDEPENDENT_AMBULATORY_CARE_PROVIDER_SITE_OTHER): Payer: Medicaid Other | Admitting: Primary Care

## 2020-12-22 DIAGNOSIS — Z7689 Persons encountering health services in other specified circumstances: Secondary | ICD-10-CM

## 2020-12-22 NOTE — Progress Notes (Signed)
[facility]  Virtual Visit via Telephone Note  I connected with Jamie Moyer, on 12/22/2020 at 3:43 PM by telephone due to the COVID-19 pandemic and verified that I am speaking with the correct person using two identifiers.   Consent: I discussed the limitations, risks, security and privacy concerns of performing an evaluation and management service by telephone and the availability of in person appointments. I also discussed with the patient that there may be a patient responsible charge related to this service. The patient expressed understanding and agreed to proceed.   Location of Patient: Home   Location of Provider: [facility]   Persons participating in Telemedicine visit: Jamie E Youlanda Mighty,  NP Cristela Felt , CMA  History of Present Illness: Jamie Moyer is a 20 year old female who is establishing care with new PCP. No complaints or concerns.   Past Medical History:  Diagnosis Date   Anxiety    Depression    Mononucleosis    Precordial catch syndrome    Spleen enlargement    Allergies  Allergen Reactions   Other     Seasonal Allergies     Cephalexin Rash    Current Outpatient Medications on File Prior to Visit  Medication Sig Dispense Refill   acetaminophen (TYLENOL) 325 MG tablet Take 2 tablets (650 mg total) by mouth every 6 (six) hours as needed for mild pain (or temp > 100). (Patient not taking: Reported on 11/11/2020)     amitriptyline (ELAVIL) 25 MG tablet Take 1 tablet nightly 30 tablet 5   benzonatate (TESSALON) 100 MG capsule Take 1 capsule (100 mg total) by mouth every 8 (eight) hours. 21 capsule 0   busPIRone (BUSPAR) 5 MG tablet Take 2.5 mg by mouth daily.     Ferrous Fumarate (HEMOCYTE - 106 MG FE) 324 (106 Fe) MG TABS tablet Take 1 tablet (106 mg of iron total) by mouth every other day. (Patient not taking: Reported on 11/11/2020) 30 tablet 3    fluticasone (FLONASE) 50 MCG/ACT nasal spray Place 2 sprays into both nostrils daily. 16 mL 0   hydrOXYzine (ATARAX/VISTARIL) 10 MG tablet take 1 tablet by mouth twice a day if needed FOR ACUTE ANXIETY  0   ibuprofen (ADVIL) 800 MG tablet Take 1 tablet (800 mg total) by mouth every 8 (eight) hours as needed. (Patient not taking: Reported on 11/11/2020) 30 tablet 0   levonorgestrel-ethinyl estradiol (ALESSE) 0.1-20 MG-MCG tablet Take 1 tablet by mouth daily. 28 tablet 11   loratadine (CLARITIN) 10 MG tablet Take 10 mg by mouth daily as needed for allergies.   1   Olopatadine HCl 0.2 % SOLN Place 1 drop into both eyes daily as needed (For allergies).  (Patient not taking: Reported on 11/11/2020)     ondansetron (ZOFRAN) 4 MG tablet Take 4 mg by mouth every 6 (six) hours as needed. (Patient not taking: Reported on 11/11/2020)     vitamin C (VITAMIN C) 500 MG tablet Take 1 tablet (500 mg total) by mouth every other day. (Patient not taking: Reported on 11/11/2020) 30 tablet 3   [DISCONTINUED] amLODipine (NORVASC) 5 MG tablet Take 1 tablet (5 mg total) by mouth daily. 30 tablet 3   No current facility-administered medications on file prior to visit.    Observations/Objective: There were no vitals taken for this visit.   Assessment and Plan: Diagnoses and all orders for this visit:  Encounter to establish  care  Establish care with new provider   Follow Up Instructions: 6 month annual    I discussed the assessment and treatment plan with the patient. The patient was provided an opportunity to ask questions and all were answered. The patient agreed with the plan and demonstrated an understanding of the instructions.   The patient was advised to call back or seek an in-person evaluation if the symptoms worsen or if the condition fails to improve as anticipated.     I provided 10 minutes total of non-face-to-face time during this encounter including median intraservice time, reviewing previous  notes, investigations, ordering medications, medical decision making, coordinating care and patient verbalized understanding at the end of the visit.    This note has been created with Education officer, environmental. Any transcriptional errors are unintentional.   Grayce Sessions, NP 12/22/2020, 3:43 PM

## 2021-01-14 ENCOUNTER — Other Ambulatory Visit (HOSPITAL_COMMUNITY)
Admission: RE | Admit: 2021-01-14 | Discharge: 2021-01-14 | Disposition: A | Discharge status: Home / Self Care | Payer: Medicaid Other | Source: Ambulatory Visit | Attending: Obstetrics and Gynecology | Admitting: Obstetrics and Gynecology

## 2021-01-14 ENCOUNTER — Ambulatory Visit (INDEPENDENT_AMBULATORY_CARE_PROVIDER_SITE_OTHER): Payer: Medicaid Other | Admitting: *Deleted

## 2021-01-14 ENCOUNTER — Other Ambulatory Visit: Payer: Self-pay

## 2021-01-14 VITALS — BP 140/98 | HR 88 | Temp 97.4°F | Ht <= 58 in | Wt 77.0 lb

## 2021-01-14 DIAGNOSIS — Z113 Encounter for screening for infections with a predominantly sexual mode of transmission: Secondary | ICD-10-CM

## 2021-01-14 NOTE — Progress Notes (Signed)
    SUBJECTIVE:  20 y.o. female in clinic for repeat std screen. Denies abnormal vaginal bleeding or significant pelvic pain or fever. No UTI symptoms. Denies history of known exposure to STD.  No LMP recorded.  OBJECTIVE:  She appears well, afebrile. Urine dipstick: not done.  ASSESSMENT:  Denies any symptoms. Elevated blood pressure  PLAN:  GC, chlamydia, trichomonas,urine cystology sent to lab. Treatment: To be determined once lab results are received Advised to follow up with PCP regarding elevated blood pressure.  Clovis Pu, RN

## 2021-01-15 LAB — URINE CYTOLOGY ANCILLARY ONLY
Chlamydia: NEGATIVE
Comment: NEGATIVE
Comment: NEGATIVE
Comment: NORMAL
Neisseria Gonorrhea: NEGATIVE
Trichomonas: NEGATIVE

## 2021-01-26 ENCOUNTER — Ambulatory Visit (INDEPENDENT_AMBULATORY_CARE_PROVIDER_SITE_OTHER): Payer: Medicaid Other | Admitting: Neurology

## 2021-01-27 ENCOUNTER — Other Ambulatory Visit: Payer: Self-pay | Admitting: *Deleted

## 2021-01-27 DIAGNOSIS — N898 Other specified noninflammatory disorders of vagina: Secondary | ICD-10-CM

## 2021-01-27 MED ORDER — FLUCONAZOLE 150 MG PO TABS: 150.0000 mg | ORAL_TABLET | Freq: Once | ORAL | 1 refills | Status: AC

## 2021-01-27 NOTE — Progress Notes (Signed)
Patient called requesting refill for Diflucan. Pt reported vaginal itching x 2 days due to take medication for HSV.  Refill sent to pharmacy.  Clovis Pu, RN

## 2021-02-06 ENCOUNTER — Ambulatory Visit (INDEPENDENT_AMBULATORY_CARE_PROVIDER_SITE_OTHER): Payer: Medicaid Other | Admitting: Neurology

## 2021-02-06 ENCOUNTER — Other Ambulatory Visit: Payer: Self-pay

## 2021-02-06 ENCOUNTER — Encounter (INDEPENDENT_AMBULATORY_CARE_PROVIDER_SITE_OTHER): Payer: Self-pay | Admitting: Neurology

## 2021-02-06 VITALS — BP 102/74 | HR 66 | Ht <= 58 in | Wt 76.2 lb

## 2021-02-06 DIAGNOSIS — F411 Generalized anxiety disorder: Secondary | ICD-10-CM | POA: Diagnosis not present

## 2021-02-06 DIAGNOSIS — G43009 Migraine without aura, not intractable, without status migrainosus: Secondary | ICD-10-CM

## 2021-02-06 DIAGNOSIS — G44209 Tension-type headache, unspecified, not intractable: Secondary | ICD-10-CM

## 2021-02-06 MED ORDER — AMITRIPTYLINE HCL 25 MG PO TABS: ORAL_TABLET | ORAL | 6 refills | Status: DC

## 2021-02-06 NOTE — Patient Instructions (Signed)
Continue the same dose of amitriptyline for now Continue with more hydration Continue taking dietary supplements particularly super B complex Get a referral from your primary care physician to see adult neurology Return in 7 months if not able to see adult neurology

## 2021-02-06 NOTE — Progress Notes (Signed)
Patient: Jamie Moyer MRN: [id number] Sex: female DOB: [birth year]  Provider: Keturah Shavers, MD Location of Care: [facility]  Note type: Routine return visit  Referral Source: Ivory Broad, MD History from: patient, [facility] chart, and mom Chief Complaint: Headache  History of Present Illness: Jamie Moyer is a 20 y.o. female is here for follow-up management of headache.  She has history of chronic migraine and tension type headaches with some anxiety issues for which she has been on amitriptyline with good headache control.  She has been tolerating medication well with no side effects. She usually sleeps well without any difficulty and with no awakening headaches.  She has a fairly good appetite although she has not gained weight.  She has thyroid function test with normal result. She was last seen in January 2022 and since then she has been doing well with on average 1 headache each month needed OTC medications.  She denies having any significant anxiety or mood issues and overall she is doing well without any other complaints or concerns.    Review of Systems: Review of system as per HPI, otherwise negative.  Past Medical History:  Diagnosis Date   Anxiety    Depression    Mononucleosis    Precordial catch syndrome    Spleen enlargement    Hospitalizations: No., Head Injury: No., Nervous System Infections: No., Immunizations up to date: Yes.     Surgical History Past Surgical History:  Procedure Laterality Date   CESAREAN SECTION N/A 09/05/2019   Procedure: CESAREAN SECTION;  Surgeon: Allie Bossier, MD;  Location: [facility];  Service: Obstetrics;  Laterality: N/A;   LAPAROSCOPIC APPENDECTOMY N/A 12/24/2019   Procedure: APPENDECTOMY LAPAROSCOPIC;  Surgeon: Axel Filler, MD;  Location[facility];  Service: General;  Laterality: N/A;   NO PAST SURGERIES      Family History family history includes Diabetes in an other family member; Healthy in her father;  Heart disease in an other family member; High blood pressure in her mother; Migraines in her mother; Panic disorder in her mother.   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   Lives with her mother. She has adult siblings that do no live in the home.    Social Determinants of Health   Financial Resource Strain: Not on file  Food Insecurity: Not on file  Transportation Needs: Not on file  Physical Activity: Not on file  Stress: Not on file  Social Connections: Not on file     Allergies  Allergen Reactions   Other     Seasonal Allergies     Cephalexin Rash    Physical Exam BP 102/74   Pulse 66   Ht 4' 9.87" (1.47 m)   Wt 76 lb  3.2 oz (34.6 kg)   BMI 16.00 kg/m  Gen: Awake, alert, not in distress, Non-toxic appearance. Skin: No neurocutaneous stigmata, no rash HEENT: Normocephalic, no dysmorphic features, no conjunctival injection, nares patent, mucous membranes moist, oropharynx clear. Neck: Supple, no meningismus, no lymphadenopathy,  Resp: Clear to auscultation bilaterally CV: Regular rate, normal S1/S2, no murmurs, no rubs Abd: Bowel sounds present, abdomen soft, non-tender, non-distended.  No hepatosplenomegaly or mass. Ext: Warm and well-perfused. No deformity, no muscle wasting, ROM full.  Neurological Examination: MS- Awake, alert, interactive Cranial  Nerves- Pupils equal, round and reactive to light (5 to 64mm); fix and follows with full and smooth EOM; no nystagmus; no ptosis, funduscopy with normal sharp discs, visual field full by looking to the sides, face symmetric with smile.  Hearing intact to bell bilaterally, palate elevation is symmetric, and tongue protrusion is symmetric. Tone- Normal Strength-Seems to have good strength, symmetrically by observation and passive movement. Reflexes-    Biceps Triceps Brachioradialis Patellar Ankle  R 2+ 2+ 2+ 2+ 2+  L 2+ 2+ 2+ 2+ 2+   Plantar responses flexor bilaterally, no clonus noted Sensation- Withdraw at four limbs to stimuli. Coordination- Reached to the object with no dysmetria Gait: Normal walk without any coordination or balance issues.   Assessment and Plan 1. Migraine without aura and without status migrainosus, not intractable   2. Anxiety state   3. Tension headache    This is a 20 year old female with episodes of migraine and tension type headaches with fairly good improvement on moderate dose of amitriptyline with no side effects and with no frequent headaches recently.  She has no focal findings on her neurological examination. She thinks that if she does not take the medication she may get more headaches so she would like to continue the medication as it is. I would recommend to continue amitriptyline 25 mg every night since she has been doing well on that medication without side effects. I discussed with patient and her mother as we did before to get a referral from her primary care physician to see adult neurology since she is above 20 now. She needs to continue with adequate hydration, more sleep and limited screen time. She may take occasional Tylenol or ibuprofen for moderate to severe headache At this time I will make a follow-up appointment in about 7 months just in case if she could not get appointment with adult neurology.  She and her mother understood and agreed  with the plan.  Meds ordered this encounter  Medications   amitriptyline (ELAVIL) 25 MG tablet    Sig: Take 1 tablet nightly    Dispense:  30 tablet    Refill:  6

## 2021-02-15 ENCOUNTER — Other Ambulatory Visit: Payer: Self-pay

## 2021-02-15 ENCOUNTER — Encounter (HOSPITAL_COMMUNITY): Payer: Self-pay

## 2021-02-15 ENCOUNTER — Ambulatory Visit (HOSPITAL_COMMUNITY)
Admission: EM | Admit: 2021-02-15 | Discharge: 2021-02-15 | Disposition: A | Discharge status: Home / Self Care | Payer: Medicaid Other | Attending: Family Medicine | Admitting: Family Medicine

## 2021-02-15 DIAGNOSIS — N898 Other specified noninflammatory disorders of vagina: Secondary | ICD-10-CM | POA: Diagnosis not present

## 2021-02-15 DIAGNOSIS — L290 Pruritus ani: Secondary | ICD-10-CM | POA: Diagnosis present

## 2021-02-15 NOTE — ED Triage Notes (Signed)
Pt reports vaginal itchy x 1 week. Pt requested pinworm test as she saw some in the stool yesterday.

## 2021-02-16 LAB — CERVICOVAGINAL ANCILLARY ONLY
Bacterial Vaginitis (gardnerella): POSITIVE — AB
Candida Glabrata: NEGATIVE
Candida Vaginitis: NEGATIVE
Chlamydia: NEGATIVE
Comment: NEGATIVE
Comment: NEGATIVE
Comment: NEGATIVE
Comment: NEGATIVE
Comment: NEGATIVE
Comment: NORMAL
Neisseria Gonorrhea: POSITIVE — AB
Trichomonas: NEGATIVE

## 2021-02-17 ENCOUNTER — Ambulatory Visit (HOSPITAL_COMMUNITY)
Admission: EM | Admit: 2021-02-17 | Discharge: 2021-02-17 | Disposition: A | Discharge status: Home / Self Care | Payer: Medicaid Other | Attending: Emergency Medicine | Admitting: Emergency Medicine

## 2021-02-17 ENCOUNTER — Ambulatory Visit: Payer: Medicaid Other

## 2021-02-17 ENCOUNTER — Encounter (HOSPITAL_COMMUNITY): Payer: Self-pay | Admitting: Emergency Medicine

## 2021-02-17 ENCOUNTER — Telehealth: Payer: Self-pay | Admitting: Obstetrics and Gynecology

## 2021-02-17 DIAGNOSIS — Z113 Encounter for screening for infections with a predominantly sexual mode of transmission: Secondary | ICD-10-CM | POA: Insufficient documentation

## 2021-02-17 DIAGNOSIS — N760 Acute vaginitis: Secondary | ICD-10-CM | POA: Diagnosis present

## 2021-02-17 DIAGNOSIS — B9689 Other specified bacterial agents as the cause of diseases classified elsewhere: Secondary | ICD-10-CM | POA: Diagnosis present

## 2021-02-17 DIAGNOSIS — A549 Gonococcal infection, unspecified: Secondary | ICD-10-CM | POA: Diagnosis present

## 2021-02-17 LAB — HIV ANTIBODY (ROUTINE TESTING W REFLEX): HIV Screen 4th Generation wRfx: NONREACTIVE

## 2021-02-17 MED ORDER — CEFTRIAXONE SODIUM 500 MG IJ SOLR
INTRAMUSCULAR | Status: AC
  Filled 2021-02-17: qty 500

## 2021-02-17 MED ORDER — METRONIDAZOLE 500 MG PO TABS: 500.0000 mg | ORAL_TABLET | Freq: Two times a day (BID) | ORAL | 0 refills | Status: AC

## 2021-02-17 MED ORDER — CEFTRIAXONE SODIUM 500 MG IJ SOLR
500.0000 mg | Freq: Once | INTRAMUSCULAR | Status: AC
  Administered 2021-02-17: 500 mg via INTRAMUSCULAR

## 2021-02-17 MED ORDER — LIDOCAINE HCL (PF) 1 % IJ SOLN
INTRAMUSCULAR | Status: AC
  Filled 2021-02-17: qty 2

## 2021-02-17 NOTE — Discharge Instructions (Addendum)
We have treated you for gonorrhea today with Rocephin.  Finish the Flagyl, even if you feel better. Give [place] a working phone number so that we can contact you if needed. Refrain from sexual contact until you know your results and your partner(s) are treated if necessary. Return to the ER if you get worse, have a fever >100.4, or for any concerns.   Go to www.goodrx.com  or www.costplusdrugs.com to look up your medications. This will give you a list of where you can find your prescriptions at the most affordable prices. Or ask the pharmacist what the cash price is, or if they have any other discount programs available to help make your medication more affordable. This can be less expensive than what you would pay with insurance.

## 2021-02-17 NOTE — ED Provider Notes (Signed)
Patient presents today for STD treatment.  She currently has no complaints.  Labs reviewed.  Patient positive for gonorrhea, BV.  Patient has had gonorrhea in February earlier this year, which was treated with Rocephin.  She is also requesting HIV, RPR testing today as well.   will send off HIV, RPR.  Home with a prescription for Flagyl.  Advised no intercourse for 2 to 3 weeks to give the medication time to work.  Results for orders placed or performed during the hospital encounter of 02/15/21  Cervicovaginal ancillary only  Result Value Ref Range   Neisseria Gonorrhea Positive (A)    Chlamydia Negative    Trichomonas Negative    Bacterial Vaginitis (gardnerella) Positive (A)    Candida Vaginitis Negative    Candida Glabrata Negative    Comment      Normal Reference Range Bacterial Vaginosis - Negative   Comment Normal Reference Range Candida Species - Negative    Comment Normal Reference Range Candida Galbrata - Negative    Comment Normal Reference Range Trichomonas - Negative    Comment Normal Reference Ranger Chlamydia - Negative    Comment      Normal Reference Range Neisseria Gonorrhea - Negative    Diagnosis: Gonorrhea  BV (bacterial vaginosis)  Screening for STDs (sexually transmitted diseases)    Domenick Gong, MD 02/20/21 [phone]

## 2021-02-17 NOTE — Telephone Encounter (Signed)
Received a call from the patient stating she needs to come in to get an STD Tx. I informed her the nurse was not in the office, and she would need to go to another one of our locations. I sent a message to Erie Noe at [facility]. She scheduled her  to come in, then I received another call from the patient stating she was going back to Urgent care to get treated.

## 2021-02-17 NOTE — ED Triage Notes (Signed)
Pt is present today for STD treatment  

## 2021-02-18 LAB — RPR: RPR Ser Ql: NONREACTIVE

## 2021-02-19 NOTE — ED Provider Notes (Signed)
[facility]    CSN: [id number] Arrival date & time: 02/15/21  1702      History   Chief Complaint Chief Complaint  Patient presents with   Vaginal Itching   pingworrms    HPI Jamie Moyer is a 20 y.o. female.   Patient presenting today with vaginal and anal itching and concern about some possible pinworms in her stools yesterday. She feels bloated additionally. Denies bloody stools, urinary frequency, dysuria, fever, chills, flank pain, abdominal pain, pelvic pain. Requesting testing for worms. No known exposures.    Past Medical History:  Diagnosis Date   Anxiety    Depression    Mononucleosis    Precordial catch syndrome    Spleen enlargement     Patient Active Problem List   Diagnosis Date Noted   Appendicitis 12/24/2019   Fetal heart rate decelerations affecting management of mother 09/04/2019   Marginal insertion of umbilical cord affecting management of mother in third trimester 09/04/2019   Gestational hypertension, third trimester 08/30/2019   Episode of hypertension 07/25/2019   Generalized anxiety disorder 04/25/2019   Supervision of normal first pregnancy, antepartum 03/20/2019   Tension headache 11/17/2015   Anxiety state 11/17/2015   Migraine without aura and without status migrainosus, not intractable 11/17/2015   Poor appetite 11/17/2015   Insomnia 11/17/2015    Past Surgical History:  Procedure Laterality Date   CESAREAN SECTION N/A 09/05/2019   Procedure: CESAREAN SECTION;  Surgeon: Allie Bossier, MD;  Location: [facility];  Service: Obstetrics;  Laterality: N/A;   LAPAROSCOPIC APPENDECTOMY N/A 12/24/2019   Procedure: APPENDECTOMY LAPAROSCOPIC;  Surgeon: Axel Filler, MD;  Location: [facility] OR;  Service: General;  Laterality: N/A;   NO PAST SURGERIES      OB History     Gravida  1   Para  1   Term      Preterm  1   AB      Living  1      SAB      IAB      Ectopic      Multiple  0   Live Births  1             Home Medications    Prior to Admission medications   Medication Sig Start Date End Date Taking? Authorizing Provider  acetaminophen (TYLENOL) 325 MG tablet Take 2 tablets (650 mg total) by mouth every 6 (six) hours as needed for mild pain (or temp > 100). 12/24/19   Maczis, Elmer Sow, PA-C  amitriptyline (ELAVIL) 25 MG tablet Take 1 tablet nightly 02/06/21   Keturah Shavers, MD  busPIRone (BUSPAR) 5 MG tablet Take 2.5 mg by mouth daily. 07/30/19   [provider]  Ferrous Fumarate (HEMOCYTE - 106 MG FE) 324 (106 Fe) MG TABS tablet Take 1 tablet (106 mg of iron total) by mouth every other day. 09/08/19   Raelyn Mora, CNM  fluticasone (FLONASE) 50 MCG/ACT nasal spray Place 2 sprays into both nostrils daily. 11/15/20   Rushie Chestnut, PA-C  hydrOXYzine (ATARAX/VISTARIL) 10 MG tablet take 1 tablet by mouth twice a day if needed FOR ACUTE ANXIETY 07/28/16   [provider]  levonorgestrel-ethinyl estradiol (ALESSE) 0.1-20 MG-MCG tablet Take 1 tablet by mouth daily. 09/12/20   Armando Reichert, CNM  loratadine (CLARITIN) 10 MG tablet Take 10 mg by mouth daily as needed for allergies.  11/07/15   [provider]  metroNIDAZOLE (FLAGYL) 500 MG tablet Take 1  tablet (500 mg total) by mouth 2 (two) times daily for 7 days. 02/17/21 02/24/21  Domenick Gong, MD  vitamin C (VITAMIN C) 500 MG tablet Take 1 tablet (500 mg total) by mouth every other day. Patient not taking: No sig reported 09/08/19   Raelyn Mora, CNM  amLODipine (NORVASC) 5 MG tablet Take 1 tablet (5 mg total) by mouth daily. 09/20/19 12/20/19  Raelyn Mora, CNM    Family History Family History  Problem Relation Age of Onset   Migraines Mother    Panic disorder Mother    High blood pressure Mother    Healthy Father    Diabetes Other    Heart disease Other     Social History Social History   Tobacco Use   Smoking status: Never    Passive exposure: Yes   Smokeless tobacco: Never  Vaping Use    Vaping Use: Never used  Substance Use Topics   Alcohol use: No   Drug use: No     Allergies   Other and Cephalexin   Review of Systems Review of Systems PER HPI  Physical Exam Triage Vital Signs ED Triage Vitals  Enc Vitals Group     BP 02/15/21 1826 124/87     Pulse Rate 02/15/21 1826 (!) 115     Resp 02/15/21 1826 16     Temp 02/15/21 1826 98.3 F (36.8 C)     Temp Source 02/15/21 1826 Oral     SpO2 02/15/21 1826 98 %     Weight --      Height --      Head Circumference --      Peak Flow --      Pain Score 02/15/21 1825 0     Pain Loc --      Pain Edu? --      Excl. in GC? --    No data found.  Updated Vital Signs BP 124/87 (BP Location: Right Arm)   Pulse (!) 115   Temp 98.3 F (36.8 C) (Oral)   Resp 16   LMP 01/24/2021 (Approximate)   SpO2 98%   Visual Acuity Right Eye Distance:   Left Eye Distance:   Bilateral Distance:    Right Eye Near:   Left Eye Near:    Bilateral Near:     Physical Exam Vitals and nursing note reviewed.  Constitutional:      Appearance: Normal appearance. She is not ill-appearing.  HENT:     Head: Atraumatic.  Eyes:     Extraocular Movements: Extraocular movements intact.     Conjunctiva/sclera: Conjunctivae normal.  Cardiovascular:     Rate and Rhythm: Normal rate and regular rhythm.     Heart sounds: Normal heart sounds.  Pulmonary:     Effort: Pulmonary effort is normal.     Breath sounds: Normal breath sounds.  Abdominal:     General: Bowel sounds are normal. There is no distension.     Palpations: Abdomen is soft.     Tenderness: There is no abdominal tenderness. There is no guarding.  Genitourinary:    Comments: GU exam deferred, self swab performed Musculoskeletal:        General: Normal range of motion.     Cervical back: Normal range of motion and neck supple.  Skin:    General: Skin is warm and dry.  Neurological:     Mental Status: She is alert and oriented to person, place, and time.   Psychiatric:  Mood and Affect: Mood normal.        Thought Content: Thought content normal.        Judgment: Judgment normal.     UC Treatments / Results  Labs (all labs ordered are listed, but only abnormal results are displayed) Labs Reviewed  CERVICOVAGINAL ANCILLARY ONLY - Abnormal; Notable for the following components:      Result Value   Neisseria Gonorrhea Positive (*)    Bacterial Vaginitis (gardnerella) Positive (*)    All other components within normal limits    EKG   Radiology No results found.  Procedures Procedures (including critical care time)  Medications Ordered in UC Medications - No data to display  Initial Impression / Assessment and Plan / UC Course  I have reviewed the triage vital signs and the nursing notes.  Pertinent labs & imaging results that were available during my care of the patient were reviewed by me and considered in my medical decision making (see chart for details).     Exam and vitals benign, vaginal swab pending due to itching, stool studies brought home for collection and return to check for Ova and parasites. Will treat based on results.   Final Clinical Impressions(s) / UC Diagnoses   Final diagnoses:  Anal itching  Vaginal irritation   Discharge Instructions   None    ED Prescriptions   None    PDMP not reviewed this encounter.   Particia Nearing, [place] 02/19/21 2222

## 2021-03-05 ENCOUNTER — Ambulatory Visit (HOSPITAL_COMMUNITY)
Admission: EM | Admit: 2021-03-05 | Discharge: 2021-03-05 | Disposition: A | Discharge status: Home / Self Care | Payer: Medicaid Other | Attending: Family Medicine | Admitting: Family Medicine

## 2021-03-05 ENCOUNTER — Encounter (HOSPITAL_COMMUNITY): Payer: Self-pay

## 2021-03-05 ENCOUNTER — Other Ambulatory Visit: Payer: Self-pay

## 2021-03-05 DIAGNOSIS — N898 Other specified noninflammatory disorders of vagina: Secondary | ICD-10-CM | POA: Diagnosis not present

## 2021-03-05 NOTE — ED Triage Notes (Signed)
Pt reports vaginal itching x 2 days. Pt requested STD's test.  

## 2021-03-05 NOTE — Discharge Instructions (Addendum)
We have sent testing for sexually transmitted infections. We will notify you of any positive results once they are received. If required, we will prescribe any medications you might need.  Please refrain from all sexual activity for at least the next seven days.  

## 2021-03-05 NOTE — ED Provider Notes (Signed)
[facility]   [id number] 03/05/21 Arrival Time: 1058  ASSESSMENT & PLAN:  1. Vaginal itching    No empiric tx.    Discharge Instructions      We have sent testing for sexually transmitted infections. We will notify you of any positive results once they are received. If required, we will prescribe any medications you might need.  Please refrain from all sexual activity for at least the next seven days.     Without s/s of PID.  Labs Reviewed  CERVICOVAGINAL ANCILLARY ONLY     Will notify of any positive results. Instructed to refrain from sexual activity for at least seven days.  Reviewed expectations re: course of current medical issues. Questions answered. Outlined signs and symptoms indicating need for more acute intervention. Patient verbalized understanding. After Visit Summary given.   SUBJECTIVE:  Jamie Moyer is a 20 y.o. female who requests STD screening. Reports vaginal itching without discharge. Was treated for gonorrhea 2 w ago per pt. Afebrile. No abdominal or pelvic pain. Normal PO intake wihout n/v. No genital rashes or lesions. Reports that she is sexually active with single female partner.   OBJECTIVE:  Vitals:   03/05/21 1224  BP: (!) 142/81  Pulse: 86  Resp: 16  Temp: 97.6 F (36.4 C)  TempSrc: Oral  SpO2: 100%     General appearance: alert, cooperative, appears stated age and no distress Lungs: unlabored respirations; speaks full sentences without difficulty Back: no CVA tenderness; FROM at waist Abdomen: soft, non-tender GU: deferred Skin: warm and dry Psychological: alert and cooperative; normal mood and affect.  Reviewed: Results for orders placed or performed during the hospital encounter of 02/17/21  RPR  Result Value Ref Range   RPR Ser Ql NON REACTIVE NON REACTIVE  HIV Antibody (routine testing w rflx)  Result Value Ref Range   HIV Screen 4th Generation wRfx Non Reactive Non Reactive    Labs Reviewed   CERVICOVAGINAL ANCILLARY ONLY    Allergies  Allergen Reactions   Other     Seasonal Allergies     Cephalexin Rash    Past Medical History:  Diagnosis Date   Anxiety    Depression    Mononucleosis    Precordial catch syndrome    Spleen enlargement    Family History  Problem Relation Age of Onset   Migraines Mother    Panic disorder Mother    High blood pressure Mother    Healthy Father    Diabetes Other    Heart disease Other    Social History   Socioeconomic History   Marital status: Single    Spouse name: Not on file   Number of children: Not on file   Years of education: Not on file   Highest education level: High school graduate  Occupational History   Occupation: Online Business  Tobacco Use   Smoking status: Never    Passive exposure: Yes   Smokeless tobacco: Never  Vaping Use   Vaping Use: Never used  Substance and Sexual Activity   Alcohol use: No   Drug use: No   Sexual activity: Yes    Birth control/protection: None  Other Topics Concern   Not on file  Social History Narrative   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   Lives with her mother. She has adult siblings that do no live in the home.    Social Determinants of Health   Financial Resource Strain: Not  on file  Food Insecurity: Not on file  Transportation Needs: Not on file  Physical Activity: Not on file  Stress: Not on file  Social Connections: Not on file  Intimate Partner Violence: Not on file           Mardella Layman, MD 03/05/21 1430

## 2021-03-06 ENCOUNTER — Other Ambulatory Visit: Payer: Self-pay

## 2021-03-06 ENCOUNTER — Telehealth (HOSPITAL_COMMUNITY): Payer: Self-pay | Admitting: Emergency Medicine

## 2021-03-06 ENCOUNTER — Encounter: Payer: Self-pay | Admitting: Emergency Medicine

## 2021-03-06 ENCOUNTER — Ambulatory Visit
Admission: EM | Admit: 2021-03-06 | Discharge: 2021-03-06 | Disposition: A | Discharge status: Home / Self Care | Payer: Medicaid Other | Attending: Urgent Care | Admitting: Urgent Care

## 2021-03-06 DIAGNOSIS — B373 Candidiasis of vulva and vagina: Secondary | ICD-10-CM

## 2021-03-06 DIAGNOSIS — F411 Generalized anxiety disorder: Secondary | ICD-10-CM | POA: Diagnosis not present

## 2021-03-06 DIAGNOSIS — A749 Chlamydial infection, unspecified: Secondary | ICD-10-CM

## 2021-03-06 DIAGNOSIS — F1290 Cannabis use, unspecified, uncomplicated: Secondary | ICD-10-CM

## 2021-03-06 DIAGNOSIS — B3731 Acute candidiasis of vulva and vagina: Secondary | ICD-10-CM

## 2021-03-06 LAB — CERVICOVAGINAL ANCILLARY ONLY
Bacterial Vaginitis (gardnerella): NEGATIVE
Candida Glabrata: NEGATIVE
Candida Vaginitis: POSITIVE — AB
Chlamydia: POSITIVE — AB
Comment: NEGATIVE
Comment: NEGATIVE
Comment: NEGATIVE
Comment: NEGATIVE
Comment: NEGATIVE
Comment: NORMAL
Neisseria Gonorrhea: NEGATIVE
Trichomonas: NEGATIVE

## 2021-03-06 MED ORDER — DOXYCYCLINE HYCLATE 100 MG PO CAPS: 100.0000 mg | ORAL_CAPSULE | Freq: Two times a day (BID) | ORAL | 0 refills | Status: AC

## 2021-03-06 MED ORDER — HYDROXYZINE HCL 25 MG PO TABS: 12.5000 mg | ORAL_TABLET | Freq: Three times a day (TID) | ORAL | 0 refills | Status: DC | PRN

## 2021-03-06 MED ORDER — FLUCONAZOLE 150 MG PO TABS: 150.0000 mg | ORAL_TABLET | Freq: Once | ORAL | 0 refills | Status: AC

## 2021-03-06 MED ORDER — ONDANSETRON 8 MG PO TBDP: 8.0000 mg | ORAL_TABLET | Freq: Three times a day (TID) | ORAL | 0 refills | Status: DC | PRN

## 2021-03-06 NOTE — ED Triage Notes (Signed)
Patient presents with request of HIV testing after recent negative test. States she has not had unprotected sex since the negative test, but still wants to be re-checked for HIV after vomiting once yesterday. Says she has problems with anxiety and presents with a nervous affect.

## 2021-03-06 NOTE — ED Provider Notes (Signed)
[facility]   MRN: [id number] DOB: 03/01/2001  Subjective:   Jamie Moyer is a 20 y.o. female presenting for repeat HIV testing.  She is very anxious about her health.  Tested positive for gonorrhea and bacterial vaginosis on 02/15/2021.  She was seen again yesterday, tested positive for chlamydia and yeast infections. Our RN Orpha Bur attempted to reach patient but was unavailable via voicemail.  She did send a message through MyChart.  Patient notes she has to pick up her doxycycline and fluconazole.  She did have an episode of nausea and vomiting, this prompted a discussion with her mother who then took her to get checked for HIV again.  Previous check in February was also negative.  Of note, patient does smoke marijuana daily.  No alcohol use.  No current facility-administered medications for this encounter.  Current Outpatient Medications:    amitriptyline (ELAVIL) 25 MG tablet, Take 1 tablet nightly, Disp: 30 tablet, Rfl: 6   busPIRone (BUSPAR) 5 MG tablet, Take 2.5 mg by mouth daily., Disp: , Rfl:    acetaminophen (TYLENOL) 325 MG tablet, Take 2 tablets (650 mg total) by mouth every 6 (six) hours as needed for mild pain (or temp > 100)., Disp: , Rfl:    doxycycline (VIBRAMYCIN) 100 MG capsule, Take 1 capsule (100 mg total) by mouth 2 (two) times daily for 7 days., Disp: 14 capsule, Rfl: 0   Ferrous Fumarate (HEMOCYTE - 106 MG FE) 324 (106 Fe) MG TABS tablet, Take 1 tablet (106 mg of iron total) by mouth every other day., Disp: 30 tablet, Rfl: 3   fluconazole (DIFLUCAN) 150 MG tablet, Take 1 tablet (150 mg total) by mouth once for 1 dose. Repeat in 3 days if symptoms persist, Disp: 2 tablet, Rfl: 0   fluticasone (FLONASE) 50 MCG/ACT nasal spray, Place 2 sprays into both nostrils daily., Disp: 16 mL, Rfl: 0   hydrOXYzine (ATARAX/VISTARIL) 10 MG tablet, take 1 tablet by mouth twice a day if needed FOR ACUTE ANXIETY, Disp: , Rfl: 0   levonorgestrel-ethinyl estradiol (ALESSE)  0.1-20 MG-MCG tablet, Take 1 tablet by mouth daily., Disp: 28 tablet, Rfl: 11   loratadine (CLARITIN) 10 MG tablet, Take 10 mg by mouth daily as needed for allergies. , Disp: , Rfl: 1   vitamin C (VITAMIN C) 500 MG tablet, Take 1 tablet (500 mg total) by mouth every other day. (Patient not taking: No sig reported), Disp: 30 tablet, Rfl: 3   Allergies  Allergen Reactions   Other     Seasonal Allergies     Cephalexin Rash    Past Medical History:  Diagnosis Date   Anxiety    Depression    Mononucleosis    Precordial catch syndrome    Spleen enlargement      Past Surgical History:  Procedure Laterality Date   CESAREAN SECTION N/A 09/05/2019   Procedure: CESAREAN SECTION;  Surgeon: Allie Bossier, MD;  Location: [facility];  Service: Obstetrics;  Laterality: N/A;   LAPAROSCOPIC APPENDECTOMY N/A 12/24/2019   Procedure: APPENDECTOMY LAPAROSCOPIC;  Surgeon: Axel Filler, MD;  Location: [facility] OR;  Service: General;  Laterality: N/A;   NO PAST SURGERIES      Family History  Problem Relation Age of Onset   Migraines Mother    Panic disorder Mother    High blood pressure Mother    Healthy Father    Diabetes Other    Heart disease Other     Social History   Tobacco  Use   Smoking status: Never    Passive exposure: Yes   Smokeless tobacco: Never  Vaping Use   Vaping Use: Never used  Substance Use Topics   Alcohol use: No   Drug use: No    ROS   Objective:   Vitals: BP (!) 146/83 (BP Location: Left Arm)   Pulse (!) 120   Temp 98.4 F (36.9 C) (Oral)   Resp 16   SpO2 99%   Physical Exam Constitutional:      General: She is not in acute distress.    Appearance: Normal appearance. She is well-developed. She is not ill-appearing, toxic-appearing or diaphoretic.  HENT:     Head: Normocephalic and atraumatic.     Nose: Nose normal.     Mouth/Throat:     Mouth: Mucous membranes are moist.     Pharynx: Oropharynx is clear.  Eyes:     General: No scleral icterus.        Right eye: No discharge.        Left eye: No discharge.     Extraocular Movements: Extraocular movements intact.     Conjunctiva/sclera: Conjunctivae normal.     Pupils: Pupils are equal, round, and reactive to light.  Cardiovascular:     Rate and Rhythm: Normal rate.  Pulmonary:     Effort: Pulmonary effort is normal.  Abdominal:     General: Bowel sounds are normal. There is no distension.     Palpations: Abdomen is soft. There is no mass.     Tenderness: There is no abdominal tenderness. There is no right CVA tenderness, left CVA tenderness, guarding or rebound.  Skin:    General: Skin is warm and dry.  Neurological:     General: No focal deficit present.     Mental Status: She is alert and oriented to person, place, and time.  Psychiatric:        Mood and Affect: Mood is anxious. Mood is not depressed or elated. Affect is not labile, blunt, flat, angry, tearful or inappropriate.        Speech: She is communicative. Speech is rapid and pressured. Speech is not delayed, slurred or tangential.        Behavior: Behavior normal. Behavior is not agitated, slowed, aggressive, withdrawn, hyperactive or combative. Behavior is cooperative.        Cognition and Memory: Cognition is not impaired. Memory is not impaired.     Assessment and Plan :   PDMP not reviewed this encounter.  1. Anxiety state   2. Chlamydia infection   3. Yeast vaginitis   4. Marijuana use     Patient is having significant anxiety about having sexually transmitted infections.  I reassured her that doing another HIV test would be inappropriate negative given the close timeline of her last test.  Also emphasized need to start taking the doxycycline, fluconazole to address her chlamydial infection, yeast vaginitis.  I did provide her with medications for anxiety and hydroxyzine and also Zofran for her nausea and vomiting.  Encouraged patient to quit smoking.  Follow-up with mental health provider soon as possible to  consider restarting antianxiety medications. Counseled patient on potential for adverse effects with medications prescribed/recommended today, ER and return-to-clinic precautions discussed, patient verbalized understanding.    Wallis Bamberg, PA-C 03/06/21 1715

## 2021-05-11 ENCOUNTER — Ambulatory Visit
Admission: EM | Admit: 2021-05-11 | Discharge: 2021-05-11 | Disposition: A | Discharge status: Home / Self Care | Payer: Medicaid Other | Attending: Physician Assistant | Admitting: Physician Assistant

## 2021-05-11 ENCOUNTER — Other Ambulatory Visit: Payer: Self-pay

## 2021-05-11 DIAGNOSIS — N898 Other specified noninflammatory disorders of vagina: Secondary | ICD-10-CM | POA: Diagnosis not present

## 2021-05-11 DIAGNOSIS — Z113 Encounter for screening for infections with a predominantly sexual mode of transmission: Secondary | ICD-10-CM | POA: Diagnosis not present

## 2021-05-11 LAB — POCT URINE PREGNANCY: Preg Test, Ur: NEGATIVE

## 2021-05-11 NOTE — ED Triage Notes (Signed)
Pt requesting STD testing. States condom broke during intercourse 2wks ago. Pt c/o clear vaginal discharge x2 days, denies odor or urinary sx's.
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
## 2021-05-11 NOTE — Discharge Instructions (Addendum)
Will call with test results, will treat if needed based on results.

## 2021-05-13 LAB — CERVICOVAGINAL ANCILLARY ONLY
Bacterial Vaginitis (gardnerella): POSITIVE — AB
Candida Glabrata: NEGATIVE
Candida Vaginitis: NEGATIVE
Chlamydia: NEGATIVE
Comment: NEGATIVE
Comment: NEGATIVE
Comment: NEGATIVE
Comment: NEGATIVE
Comment: NEGATIVE
Comment: NORMAL
Neisseria Gonorrhea: POSITIVE — AB
Trichomonas: NEGATIVE

## 2021-05-14 ENCOUNTER — Telehealth (HOSPITAL_COMMUNITY): Payer: Self-pay | Admitting: Emergency Medicine

## 2021-05-14 MED ORDER — METRONIDAZOLE 500 MG PO TABS: 500.0000 mg | ORAL_TABLET | Freq: Two times a day (BID) | ORAL | 0 refills | Status: DC

## 2021-05-15 ENCOUNTER — Ambulatory Visit
Admission: RE | Admit: 2021-05-15 | Discharge: 2021-05-15 | Disposition: A | Discharge status: Home / Self Care | Payer: Medicaid Other | Source: Ambulatory Visit | Attending: Physician Assistant | Admitting: Physician Assistant

## 2021-05-15 ENCOUNTER — Other Ambulatory Visit: Payer: Self-pay

## 2021-05-15 DIAGNOSIS — A549 Gonococcal infection, unspecified: Secondary | ICD-10-CM | POA: Diagnosis not present

## 2021-05-15 MED ORDER — CEFTRIAXONE SODIUM 500 MG IJ SOLR
500.0000 mg | Freq: Once | INTRAMUSCULAR | Status: AC
  Administered 2021-05-15: 500 mg via INTRAMUSCULAR

## 2021-06-08 ENCOUNTER — Encounter: Payer: Self-pay | Admitting: Obstetrics and Gynecology

## 2021-08-20 ENCOUNTER — Ambulatory Visit: Payer: Medicaid Other | Admitting: Obstetrics and Gynecology

## 2021-09-02 ENCOUNTER — Encounter (HOSPITAL_COMMUNITY): Payer: Self-pay

## 2021-09-02 ENCOUNTER — Emergency Department (HOSPITAL_COMMUNITY): Payer: Medicaid Other

## 2021-09-02 ENCOUNTER — Other Ambulatory Visit: Payer: Self-pay

## 2021-09-02 ENCOUNTER — Emergency Department (HOSPITAL_COMMUNITY)
Admission: EM | Admit: 2021-09-02 | Discharge: 2021-09-03 | Disposition: A | Discharge status: Home / Self Care | Payer: Medicaid Other | Attending: Emergency Medicine | Admitting: Emergency Medicine

## 2021-09-02 DIAGNOSIS — R519 Headache, unspecified: Secondary | ICD-10-CM | POA: Diagnosis not present

## 2021-09-02 DIAGNOSIS — S0990XA Unspecified injury of head, initial encounter: Secondary | ICD-10-CM | POA: Diagnosis not present

## 2021-09-02 DIAGNOSIS — R000 Tachycardia, unspecified: Secondary | ICD-10-CM | POA: Diagnosis not present

## 2021-09-02 DIAGNOSIS — S022XXA Fracture of nasal bones, initial encounter for closed fracture: Secondary | ICD-10-CM | POA: Diagnosis not present

## 2021-09-02 DIAGNOSIS — S0992XA Unspecified injury of nose, initial encounter: Secondary | ICD-10-CM | POA: Diagnosis present
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
## 2021-09-04 ENCOUNTER — Telehealth: Payer: Self-pay | Admitting: Certified Nurse Midwife

## 2021-09-04 NOTE — Telephone Encounter (Signed)
Attempted to reach patient several times about the changes being made in this location. Left a detailed message for her to call.

## 2021-09-21 ENCOUNTER — Encounter (INDEPENDENT_AMBULATORY_CARE_PROVIDER_SITE_OTHER): Payer: Self-pay | Admitting: Neurology

## 2021-09-21 ENCOUNTER — Other Ambulatory Visit: Payer: Self-pay

## 2021-09-21 ENCOUNTER — Ambulatory Visit (INDEPENDENT_AMBULATORY_CARE_PROVIDER_SITE_OTHER): Payer: Medicaid Other | Admitting: Neurology

## 2021-09-21 VITALS — BP 120/80 | HR 100 | Wt 76.3 lb

## 2021-09-21 DIAGNOSIS — G44209 Tension-type headache, unspecified, not intractable: Secondary | ICD-10-CM

## 2021-09-21 DIAGNOSIS — F411 Generalized anxiety disorder: Secondary | ICD-10-CM

## 2021-09-21 DIAGNOSIS — G43009 Migraine without aura, not intractable, without status migrainosus: Secondary | ICD-10-CM

## 2021-09-21 MED ORDER — AMITRIPTYLINE HCL 25 MG PO TABS: ORAL_TABLET | ORAL | 6 refills | Status: DC

## 2021-09-21 NOTE — Progress Notes (Signed)
Patient: Jamie Moyer MRN: [id number] ?Sex: female DOB: 15-Feb-2001 ? ?Provider: Keturah Shavers, MD ?Location of Care: [facility] ? ?Note type: Routine return visit ? ?Referral Source: Ivory Broad, MD ?History from: mother and patient ?Chief Complaint: 2-3 migraines in the last two weeks. No dizziness, no nausea, no vomitting, meds was taken ? ?History of Present Illness: ?Jamie Moyer is a 21 y.o. female is here for follow-up management of headaches.  She has a diagnosis of chronic migraine and tension type headaches as well as some anxiety issues for which she has been on amitriptyline with good headache control. ?She was last seen in July and since then she has been doing very well with on average 3 or 4 headaches each month as she may need to take OTC medications just for a couple of them and usually the headaches are with moderate intensity without having any nausea or vomiting.  She usually sleeps well without any difficulty and with no awakening headaches. ?She does have some anxiety issues for which she was on BuSpar for a while but she was not feeling good on that medication so a few days ago she discontinued the medication. ?Overall she has been doing fairly well without having any frequent or significant headaches and has been tolerating amitriptyline well with no side effects. ? ? ?Review of Systems: ?Review of system as per HPI, otherwise negative. ? ?Past Medical History:  ?Diagnosis Date  ? Anxiety   ? Depression   ? Mononucleosis   ? Precordial catch syndrome   ? Spleen enlargement   ? ?Hospitalizations: No., Head Injury: No., Nervous System Infections: No., Immunizations up to date: Yes.   ? ? ?Surgical History ?Past Surgical History:  ?Procedure Laterality Date  ? CESAREAN SECTION N/A 09/05/2019  ? Procedure: CESAREAN SECTION;  Surgeon: Allie Bossier, MD;  Location: [facility] ORS;  Service: Obstetrics;  Laterality: N/A;  ? LAPAROSCOPIC APPENDECTOMY N/A 12/24/2019  ? Procedure:  APPENDECTOMY LAPAROSCOPIC;  Surgeon: Axel Filler, MD;  Location: [facility] OR;  Service: General;  Laterality: N/A;  ? NO PAST SURGERIES    ? ? ?Family History ?family history includes Diabetes in an other family member; Healthy in her father; Heart disease in an other family member; High blood pressure in her mother; Migraines in her mother; Panic disorder in her mother. ? ? ?Social History ?Social History  ? ?Socioeconomic History  ? Marital status: Single  ?  Spouse name: Not on file  ? Number of children: Not on file  ? Years of education: Not on file  ? Highest education level: High school graduate  ?Occupational History  ? Occupation: Corporate investment banker  ?Tobacco Use  ? Smoking status: Never  ?  Passive exposure: Never  ? Smokeless tobacco: Never  ?Vaping Use  ? Vaping Use: Never used  ?Substance and Sexual Activity  ? Alcohol use: No  ? Drug use: No  ? Sexual activity: Yes  ?  Birth control/protection: None  ?Other Topics Concern  ? Not on file  ?Social History Narrative  ? 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  ? Lives with her mother. She has adult siblings that do no live in the home.   ? ?Social Determinants of Health  ? ?Financial Resource Strain: Not on file  ?Food Insecurity: Not on file  ?Transportation Needs: Not on file  ?Physical Activity: Not on file  ?Stress: Not on file  ?Social Connections: Not on file  ? ? ? ?  Allergies  ?Allergen Reactions  ? Other   ?  Seasonal Allergies  ?  ? Cephalexin Rash  ? ? ?Physical Exam ?BP 120/80   Pulse 100   Wt 76 lb 4.5 oz (34.6 kg)   HC 19.69" (50 cm)   BMI 15.94 kg/m?  ?Gen: Awake, alert, not in distress ?Skin: No rash, No neurocutaneous stigmata. ?HEENT: Normocephalic, no dysmorphic features, no conjunctival injection, nares patent, mucous membranes moist, oropharynx clear. ?Neck: Supple, no meningismus. No focal tenderness. ?Resp: Clear to auscultation bilaterally ?CV: Regular rate, normal S1/S2, no murmurs, no rubs ?Abd: BS present,  abdomen soft, non-tender, non-distended. No hepatosplenomegaly or mass ?Ext: Warm and well-perfused. No deformities, no muscle wasting, ROM full. ? ?Neurological Examination: ?MS: Awake, alert, interactive. Normal eye contact, answered the questions appropriately, speech was fluent,  Normal comprehension.  Attention and concentration were normal. ?Cranial Nerves: Pupils were equal and reactive to light ( 5-60mm);  normal fundoscopic exam with sharp discs, visual field full with confrontation test; EOM normal, no nystagmus; no ptsosis, no double vision, intact facial sensation, face symmetric with full strength of facial muscles, hearing intact to finger rub bilaterally, palate elevation is symmetric, tongue protrusion is symmetric with full movement to both sides.  Sternocleidomastoid and trapezius are with normal strength. ?Tone-Normal ?Strength-Normal strength in all muscle groups ?DTRs- ? Biceps Triceps Brachioradialis Patellar Ankle  ?R 2+ 2+ 2+ 2+ 2+  ?L 2+ 2+ 2+ 2+ 2+  ? ?Plantar responses flexor bilaterally, no clonus noted ?Sensation: Intact to light touch, Romberg negative. ?Coordination: No dysmetria on FTN test. No difficulty with balance. ?Gait: Normal walk and run. Tandem gait was normal. Was able to perform toe walking and heel walking without difficulty. ? ? ?Assessment and Plan ?1. Migraine without aura and without status migrainosus, not intractable   ?2. Anxiety state   ?3. Tension headache   ? ?This is a 21-year-old female with chronic migraine and tension type headaches for the past few years as well as some anxiety issues, currently on moderate dose of amitriptyline at 25 mg every night with good symptoms control and just occasional mild to moderate headaches.  She has no focal findings on her neurological examination. ?Recommend to continue with the same dose of amitriptyline at 25 mg every night ?She needs to continue with more hydration, adequate sleep and limited screen time ?In terms of  taking or not taking BuSpar, she needs to discuss this with her provider who prescribed that and decide what to do. ?As we discussed last time, I told patient and her mother that anytime that she get a referral to see adult neurology, she does not need to continue follow-up with me otherwise I would like to see her in 7 months for follow-up visit and adjust the dose of medication if needed.  She and her mother understood and agreed with the plan. ? ?Meds ordered this encounter  ?Medications  ? amitriptyline (ELAVIL) 25 MG tablet  ?  Sig: Take 1 tablet nightly  ?  Dispense:  30 tablet  ?  Refill:  6  ? ?No orders of the defined types were placed in this encounter. ? ?

## 2021-09-21 NOTE — Patient Instructions (Signed)
Continue the same dose of amitriptyline every night ?Continue with more hydration and adequate sleep ?Call my office if there are more frequent headaches ?Return in 7 months for follow-up visit ?

## 2021-09-24 ENCOUNTER — Ambulatory Visit: Payer: Medicaid Other | Admitting: Obstetrics and Gynecology

## 2021-09-24 ENCOUNTER — Ambulatory Visit (INDEPENDENT_AMBULATORY_CARE_PROVIDER_SITE_OTHER): Payer: Medicaid Other | Admitting: Family Medicine

## 2021-09-24 ENCOUNTER — Encounter: Payer: Self-pay | Admitting: Family Medicine

## 2021-09-24 ENCOUNTER — Other Ambulatory Visit (HOSPITAL_COMMUNITY)
Admission: RE | Admit: 2021-09-24 | Discharge: 2021-09-24 | Disposition: A | Discharge status: Home / Self Care | Payer: Medicaid Other | Source: Ambulatory Visit | Attending: Family Medicine | Admitting: Family Medicine

## 2021-09-24 ENCOUNTER — Other Ambulatory Visit: Payer: Self-pay

## 2021-09-24 VITALS — BP 146/83 | HR 120 | Ht <= 58 in | Wt 77.0 lb

## 2021-09-24 DIAGNOSIS — Z8619 Personal history of other infectious and parasitic diseases: Secondary | ICD-10-CM | POA: Insufficient documentation

## 2021-09-24 DIAGNOSIS — Z23 Encounter for immunization: Secondary | ICD-10-CM | POA: Diagnosis not present

## 2021-09-24 DIAGNOSIS — Z0000 Encounter for general adult medical examination without abnormal findings: Secondary | ICD-10-CM | POA: Diagnosis not present

## 2021-09-24 DIAGNOSIS — R030 Elevated blood-pressure reading, without diagnosis of hypertension: Secondary | ICD-10-CM

## 2021-09-24 MED ORDER — BLOOD PRESSURE KIT DEVI: 1.0000 | 0 refills | Status: DC | PRN

## 2021-09-24 MED ORDER — BLOOD PRESSURE MONITORING DEVI: 0 refills | Status: DC

## 2021-09-24 NOTE — Progress Notes (Signed)
? ? ?GYNECOLOGY ANNUAL PREVENTATIVE CARE ENCOUNTER NOTE ? ?History:    ? Jamie Moyer is a 21 y.o. G33P0101 female here for a routine annual exam.  Current complaints: None.    ? ?She denies abnormal vaginal bleeding, discharge, pelvic pain, problems with intercourse or other gynecologic concerns.  She tested positive for gonorrhea in 04/2021 and states she received an injection for treatment.  She has not been tested for cure.  Declines other STD screening/lab work today. ? ?She also reports that she had a cyst on her right breast several years ago and had an ultrasound.  She is not sure if she needed to follow-up again after that.  She no longer has any mass or cystlike structure in this area. ? ?She would like her Tdap today.  ? ?She would like a referral for primary care doctor.  She currently follows with [facility] and neurology for her depression/anxiety/migraines. ?  ?Gynecologic History ?Patient's last menstrual period was 09/16/2021. ?Contraception: none, not currently interested.  ?Last Pap: First pap at age 45.  ?Last mammogram: Not yet, no family history of breast cancer.  ? ?Obstetric History ?OB History  ?Gravida Para Term Preterm AB Living  ?1 1   1   1   ?SAB IAB Ectopic Multiple Live Births  ?      0 1  ?  ?# Outcome Date GA Lbr Len/2nd Weight Sex Delivery Anes PTL Lv  ?1 Preterm 09/06/19 [redacted]w[redacted]d  5 lb 1.5 oz (2.31 kg) F CS-LVertical Spinal  LIV  ? ? ?Past Medical History:  ?Diagnosis Date  ? Anxiety   ? Depression   ? 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 Mononucleosis   ? Precordial catch syndrome   ? Spleen enlargement   ? Supervision of normal first pregnancy, antepartum 03/20/2019  ?  Nursing Staff Provider  Office Location  [place] Dating  LMP 12/30/2018  Language  English Anatomy US  Normal, but limited d/t fetal position. F/U Scheduled  Flu Vaccine   [ ]  Received    Valu.Nieves ] Declined Genetic Screen  NIPS:Low risk girl   AFP:   First  Screen:  Quad:    TDaP vaccine    [ ]  Received    Valu.Nieves ] Declined Hgb A1C or  GTT Early  Third trimester   Rhogam  N/A   LAB RESULTS   Feeding Pla  ? ? ?Past Surgical History:  ?Procedure Laterality Date  ? CESAREAN SECTION N/A 09/05/2019  ? Procedure: CESAREAN SECTION;  Surgeon: Emily Filbert, MD;  Location: [facility];  Service: Obstetrics;  Laterality: N/A;  ? LAPAROSCOPIC APPENDECTOMY N/A 12/24/2019  ? Procedure: APPENDECTOMY LAPAROSCOPIC;  Surgeon: Ralene Ok, MD;  Location: [place];  Service: General;  Laterality: N/A;  ? NO PAST SURGERIES    ? ? ?Current Outpatient Medications on File Prior to Visit  ?Medication Sig Dispense Refill  ? acetaminophen (TYLENOL) 325 MG tablet Take 2 tablets (650 mg total) by mouth every 6 (six) hours as needed for mild pain (or temp > 100).    ? amitriptyline (ELAVIL) 25 MG tablet Take 1 tablet nightly 30 tablet 6  ? busPIRone (BUSPAR) 5 MG tablet Take 2.5 mg by mouth daily.    ? Ferrous Fumarate (HEMOCYTE - 106 MG FE) 324 (106 Fe) MG TABS tablet Take 1 tablet (106 mg of iron total) by mouth every other day. 30 tablet 3  ? fluticasone (FLONASE) 50 MCG/ACT  nasal spray Place 2 sprays into both nostrils daily. 16 mL 0  ? loratadine (CLARITIN) 10 MG tablet Take 10 mg by mouth daily as needed for allergies.   1  ? vitamin C (VITAMIN C) 500 MG tablet Take 1 tablet (500 mg total) by mouth every other day. 30 tablet 3  ? [DISCONTINUED] amLODipine (NORVASC) 5 MG tablet Take 1 tablet (5 mg total) by mouth daily. 30 tablet 3  ? ?No current facility-administered medications on file prior to visit.  ? ? ?Allergies  ?Allergen Reactions  ? Other   ?  Seasonal Allergies  ?  ? Cephalexin Rash  ? ? ?Social History:  reports that she has never smoked. She has never been exposed to tobacco smoke. She has never used smokeless tobacco. She reports that she does not currently use alcohol after a past usage of about 1.0 standard drink per week. She reports that she does not currently use drugs after having  used the following drugs: Marijuana. ? ?Family History  ?Problem Relation Age of Onset  ? Migraines Mother   ? Panic disorder Mother   ? High blood pressure Mother   ? Healthy Father   ? Kidney disease Maternal Grandmother   ? Diabetes Other   ? Heart disease Other   ? ? ?The following portions of the patient's history were reviewed and updated as appropriate: allergies, current medications, past family history, past medical history, past social history, past surgical history and problem list. ? ?Review of Systems ?Pertinent items noted in HPI and remainder of comprehensive ROS otherwise negative. ? ?Physical Exam:  ?BP (!) 146/83   Pulse (!) 120   Ht 4\' 10"  (1.473 m)   Wt 77 lb (34.9 kg)   LMP 09/16/2021   BMI 16.09 kg/m?  ?CONSTITUTIONAL: Well-developed, well-nourished female in no acute distress.  ?HENT:  Normocephalic, atraumatic ?EYES: Conjunctivae and EOM are normal. Pupils are equal.  ?NECK: Normal range of motion ?SKIN: Skin is warm and dry.  ?MUSCULOSKELETAL: Normal range of motion.  ?NEUROLOGIC: Alert and oriented to person, place, and time.  ?PSYCHIATRIC: Normal mood and affect, but does appear anxious. Normal behavior.  ?CARDIOVASCULAR: Normal heart rate noted, regular rhythm ?RESPIRATORY: Breath sounds and effort normal, no problems with respiration noted. ?BREASTS: Symmetric in size.  ?ABDOMEN: Soft, no distention noted. Normal BS.  ?PELVIC: deferred. Self swabbed.  ?  ?Assessment and Plan:  ? ?1. Annual physical exam ?Overall doing well. Given Tdap today.  ? ?2. Elevated blood pressure reading ?BP >130/80 x2 in the office today.  Through chart review, note intermittently elevated readings as well however most occur during ED visits (normal at her neurologist's a few days ago).  Previous history of gestational hypertension during pregnancy in 2021.  Asymptomatic.  Sent in a BP cuff to start monitoring at home with goal <130/80. ?- Blood Pressure Monitoring DEVI; Monitor BP every few days.   Dispense: 1 Units; Refill: 0 ? ?3. Need for Tdap vaccination ?- Tdap vaccine greater than or equal to 7yo IM ? ?4. History of gonorrhea ?Positive on 04/2021 and treated. No symptoms. Will get TOC.  ?- Cervicovaginal ancillary only( [place])  ? ?5. History of breast cyst  ?Resolved.  Reviewed 2019 ultrasound showing sebaceous cyst versus epidermal inclusion cyst in her right breast.  Provided reassurance.  They recommended no further follow-up unless recurrent, otherwise mammogram at age 89. ? ?Discussed different locations for PCP in [place] area, encouraged to call offices and see if they are  accepting patients.  Discussed if she is unable to get into PCP office in the next several months and has elevated blood pressures at home, that she can follow-up with myself to discuss medical management. ? ?Otherwise follow-up in 1 year for Pap smear. ? ?   ? ?Darrelyn Hillock, DO  ?OB Fellow, Faculty Practice ?[place] [facility] ?09/24/2021 12:15 PM ? ?

## 2021-09-24 NOTE — Patient Instructions (Addendum)
[facility] or [facility]-- you can call these offices and see if they are accepting patients with your insurance.  ? ?Check your blood pressure at home every few days and keep a log. Goal is to have your blood pressure <130/80.  ? ?See you in 1 year or sooner if your blood pressure if consistently high and you are unable to get primary care provider.  ?

## 2021-09-24 NOTE — Progress Notes (Signed)
Patient presents for AEX. ?Denies any vaginal symptoms or complaints today. ?Using condoms for contraception- wants more information about Nexplanon.  ?Declines STD screen and blood work today.  ?Desires a referral to PCP to help manage anxiety/depression medications.  ?BP 146/83 P 120 ?Repeat BP 138/83 P 114 ?

## 2021-09-25 LAB — CERVICOVAGINAL ANCILLARY ONLY
Chlamydia: POSITIVE — AB
Comment: NEGATIVE
Comment: NEGATIVE
Comment: NORMAL
Neisseria Gonorrhea: NEGATIVE
Trichomonas: NEGATIVE

## 2021-09-26 ENCOUNTER — Telehealth: Payer: Medicaid Other | Admitting: Physician Assistant

## 2021-09-26 DIAGNOSIS — A749 Chlamydial infection, unspecified: Secondary | ICD-10-CM | POA: Diagnosis not present

## 2021-09-26 MED ORDER — DOXYCYCLINE HYCLATE 100 MG PO TABS
100.0000 mg | ORAL_TABLET | Freq: Two times a day (BID) | ORAL | 0 refills | Status: AC
Start: 2021-09-26 — End: 2021-10-03

## 2021-09-26 NOTE — Progress Notes (Signed)
?Virtual Visit Consent  ? ?Jamie Moyer, you are scheduled for a virtual visit with a [facility] provider today.   ?  ?Just as with appointments in the office, your consent must be obtained to participate.  Your consent will be active for this visit and any virtual visit you may have with one of our providers in the next 365 days.   ?  ?If you have a MyChart account, a copy of this consent can be sent to you electronically.  All virtual visits are billed to your insurance company just like a traditional visit in the office.   ? ?As this is a virtual visit, video technology does not allow for your provider to perform a traditional examination.  This may limit your provider's ability to fully assess your condition.  If your provider identifies any concerns that need to be evaluated in person or the need to arrange testing (such as labs, EKG, etc.), we will make arrangements to do so.   ?  ?Although advances in technology are sophisticated, we cannot ensure that it will always work on either your end or our end.  If the connection with a video visit is poor, the visit may have to be switched to a telephone visit.  With either a video or telephone visit, we are not always able to ensure that we have a secure connection.    ? ?I need to obtain your verbal consent now.   Are you willing to proceed with your visit today?  ?  ?Jamie Moyer has provided verbal consent on 09/26/2021 for a virtual visit (video or telephone). ?  ?Jamie Rad, PA-C  ? ?Date: 09/26/2021 5:20 PM ? ? ?Virtual Visit via Video Note  ? ?IKennieth Moyer, connected with  Jamie Moyer  ([id number], [birth year]) on 09/26/21 at  5:15 PM EST by a video-enabled telemedicine application and verified that I am speaking with the correct person using two identifiers. ? ?Location: ?Patient: Virtual Visit Location Patient: Home ?Provider: Virtual Visit Location Provider: Home Office ?  ?I discussed the limitations of evaluation and management by  telemedicine and the availability of in person appointments. The patient expressed understanding and agreed to proceed.   ? ?History of Present Illness: ?Jamie Moyer is a 21 y.o. who identifies as a female who was assigned female at birth, and is being seen today for positive chlamydia test result.  States that she saw her gynecology on September 24, 2021, states that vaginal swab was completed due to history of gonorrhea and had not had test of cure for that.  Denies any STI symptoms at this time. ? ?Denies fever, chills, nausea, vomiting ? ?HPI: HPI  ?Problems:  ?Patient Active Problem List  ? Diagnosis Date Noted  ? Appendicitis 12/24/2019  ? Gestational hypertension, third trimester 08/30/2019  ? Generalized anxiety disorder 04/25/2019  ? Tension headache 11/17/2015  ? Anxiety state 11/17/2015  ? Migraine without aura and without status migrainosus, not intractable 11/17/2015  ? Poor appetite 11/17/2015  ? Insomnia 11/17/2015  ?  ?Allergies:  ?Allergies  ?Allergen Reactions  ? Other   ?  Seasonal Allergies  ?  ? Cephalexin Rash  ? ?Medications:  ?Current Outpatient Medications:  ?  acetaminophen (TYLENOL) 325 MG tablet, Take 2 tablets (650 mg total) by mouth every 6 (six) hours as needed for mild pain (or temp > 100)., Disp: , Rfl:  ?  amitriptyline (ELAVIL) 25 MG tablet, Take 1 tablet nightly, Disp:  30 tablet, Rfl: 6 ?  Blood Pressure Monitoring DEVI, Monitor BP every few days., Disp: 1 Units, Rfl: 0 ?  busPIRone (BUSPAR) 5 MG tablet, Take 2.5 mg by mouth daily., Disp: , Rfl:  ?  Ferrous Fumarate (HEMOCYTE - 106 MG FE) 324 (106 Fe) MG TABS tablet, Take 1 tablet (106 mg of iron total) by mouth every other day., Disp: 30 tablet, Rfl: 3 ?  fluticasone (FLONASE) 50 MCG/ACT nasal spray, Place 2 sprays into both nostrils daily., Disp: 16 mL, Rfl: 0 ?  loratadine (CLARITIN) 10 MG tablet, Take 10 mg by mouth daily as needed for allergies. , Disp: , Rfl: 1 ?  vitamin C (VITAMIN C) 500 MG tablet, Take 1 tablet (500 mg  total) by mouth every other day., Disp: 30 tablet, Rfl: 3 ? ?Observations/Objective: ?Patient is well-developed, well-nourished in no acute distress.  ?Resting comfortably  at home.  ?Head is normocephalic, atraumatic.  ?No labored breathing.  ?Speech is clear and coherent with logical content.  ?Patient is alert and oriented at baseline.  ? ? ?Assessment and Plan: ?1. Chlamydia ?Patient education given on disease processes, patient encouraged to notify partners.  Red flags given for prompt reevaluation. ?- doxycycline (VIBRA-TABS) 100 MG tablet; Take 1 tablet (100 mg total) by mouth 2 (two) times daily for 7 days.  Dispense: 14 tablet; Refill: 0 ? ?Follow Up Instructions: ?I discussed the assessment and treatment plan with the patient. The patient was provided an opportunity to ask questions and all were answered. The patient agreed with the plan and demonstrated an understanding of the instructions.  A copy of instructions were sent to the patient via MyChart unless otherwise noted below.  ? ? ? ?The patient was advised to call back or seek an in-person evaluation if the symptoms worsen or if the condition fails to improve as anticipated. ? ?Time:  ?I spent 9 minutes with the patient via telehealth technology discussing the above problems/concerns.   ? ?Jamie Moyer S Mayers, PA-C ? ?

## 2021-09-26 NOTE — Patient Instructions (Signed)
Jamie DeistShalyse E Roebuck, thank you for joining Roney Jaffeari S Mayers, PA-C for today's virtual visit.  While this provider is not your primary care provider (PCP), if your PCP is located in our provider database this encounter information will be shared with them immediately following your visit.  Consent: (Patient) Jamie Moyer provided verbal consent for this virtual visit at the beginning of the encounter.  Current Medications:  Current Outpatient Medications:    doxycycline (VIBRA-TABS) 100 MG tablet, Take 1 tablet (100 mg total) by mouth 2 (two) times daily for 7 days., Disp: 14 tablet, Rfl: 0   acetaminophen (TYLENOL) 325 MG tablet, Take 2 tablets (650 mg total) by mouth every 6 (six) hours as needed for mild pain (or temp > 100)., Disp: , Rfl:    amitriptyline (ELAVIL) 25 MG tablet, Take 1 tablet nightly, Disp: 30 tablet, Rfl: 6   Blood Pressure Monitoring DEVI, Monitor BP every few days., Disp: 1 Units, Rfl: 0   busPIRone (BUSPAR) 5 MG tablet, Take 2.5 mg by mouth daily., Disp: , Rfl:    Ferrous Fumarate (HEMOCYTE - 106 MG FE) 324 (106 Fe) MG TABS tablet, Take 1 tablet (106 mg of iron total) by mouth every other day., Disp: 30 tablet, Rfl: 3   fluticasone (FLONASE) 50 MCG/ACT nasal spray, Place 2 sprays into both nostrils daily., Disp: 16 mL, Rfl: 0   loratadine (CLARITIN) 10 MG tablet, Take 10 mg by mouth daily as needed for allergies. , Disp: , Rfl: 1   vitamin C (VITAMIN C) 500 MG tablet, Take 1 tablet (500 mg total) by mouth every other day., Disp: 30 tablet, Rfl: 3   Medications ordered in this encounter:  Meds ordered this encounter  Medications   doxycycline (VIBRA-TABS) 100 MG tablet    Sig: Take 1 tablet (100 mg total) by mouth 2 (two) times daily for 7 days.    Dispense:  14 tablet    Refill:  0    Order Specific Question:   Supervising Provider    Answer:   Hyacinth MeekerMILLER, BRIAN [[id number]]     *If you need refills on other medications prior to your next appointment, please contact your  pharmacy*  Follow-Up: Call back or seek an in-person evaluation if the symptoms worsen or if the condition fails to improve as anticipated.  Other Instructions Please let us know if there is anything else we can do for you.  You should schedule a test of cure for 3 months.   If you have been instructed to have an in-person evaluation today at a local Urgent Care facility, please use the link below. It will take you to a list of all of our available [facility], including address, phone number and hours of operation. Please do not delay care.  [facility]  If you or a family member do not have a primary care provider, use the link below to schedule a visit and establish care. When you choose a [facility] primary care physician or advanced practice provider, you gain a long-term partner in health. Find a Primary Care Provider  Learn more about [facility]'s in-office and virtual care options: [facility] - Get Care Now  Chlamydia, Female Chlamydia is an STI (sexually transmitted infection) that is caused by bacteria. This infection spreads through sexual contact. Chlamydia can occur in different areas of the body, including: The urethra. This is the part of the body that drains urine from the bladder. The cervix. This is  the lowest part of the uterus. The throat. The rectum. This condition is not difficult to treat. However, if left untreated, chlamydia can lead to more serious health problems, including pelvic inflammatory disease (PID). PID can increase your risk of being unable to have children. Also, women with untreated chlamydia who are pregnant or become pregnant can spread the infection to their babies during delivery. This may cause serious health problems for their babies. What are the causes? This condition is caused by the bacteria Chlamydia trachomatis. The bacteria are spread from an infected partner during sexual activity. Chlamydia can spread through  contact with the genitals, mouth, or rectum. What increases the risk? The following factors may make you more likely to develop this condition: Not using a condom the right way or not using a condom every time you have sex. Having a new sex partner or having more than one sex partner. Being female, age 78-25, and sexually active. What are the signs or symptoms? In some cases, there are no symptoms, especially early in the infection. Having no symptoms is also called being asymptomatic. If symptoms develop, they may include: Urinating often, or a burning feeling during urination. Discharge from the vagina. Redness, soreness, or swelling of the rectum, or bleeding or discharge coming from the rectum. Any of these may occur if the infection was spread through anal sex. Pain in the abdomen. Pain during sex. Bleeding between menstrual periods or irregular periods. Itching, burning, or redness in the eyes, or discharge from the eyes. How is this diagnosed? This condition may be diagnosed with: Urine tests. Swab tests. Depending on your symptoms, your health care provider may use a cotton swab to collect discharge from your vagina or rectum to test for the bacteria. A pelvic exam. How is this treated? This condition is treated with antibiotic medicines. If you are pregnant, you will need to avoid certain types of antibiotics. Follow these instructions at home: Sexual activity Tell your sex partner or partners about your infection. These include any partners for oral, anal, or vaginal sex that you have had within 60 days of when your symptoms started. Sex partners should also be treated, even if they have no signs of the disease. Do not have sex until you and your sex partners have completed treatment and your health care provider says it is okay. If your health care provider prescribed you a single-dose medicine as treatment, wait at least 7 days after taking the medicine before having  sex. General instructions Take over-the-counter and prescription medicines as told by your health care provider. Finish all antibiotic medicine even when you start to feel better. It is up to you to get your test results. Ask your health care provider, or the department that is doing the test, when your results will be ready. Get plenty of rest. Eat a healthy, well-balanced diet. Drink enough fluids to keep your urine pale yellow. Keep all follow-up visits as told by your health care provider. This is important. You may need to be tested for infection again 3 months after treatment. How is this prevented? The only sure way to prevent chlamydia is to avoid having vaginal, anal, and oral sex. However, you can lower your risk by: Using latex condoms correctly every time you have sex. Not having multiple sex partners. Asking if your sex partner has been tested for STIs and had negative results. Getting regular health screenings to check for STIs. Contact a health care provider if: You develop new symptoms or  your symptoms do not get better after you complete treatment. You have a fever or chills. You have pain during sex. You develop new joint pain or swelling near your joints. You have irregular menstrual periods, or you have bleeding between periods or after sex. You develop flu-like symptoms, such as night sweats, sore throat, or muscle aches. You are pregnant and you develop symptoms of chlamydia. Get help right away if: Your pain gets worse and does not get better with medicine. You have pain in your abdomen or lower back that does not get better with medicine. You feel weak or dizzy, or you faint. Summary Chlamydia is an STI (sexually transmitted infection) that is caused by bacteria. This infection spreads through sexual contact. This condition is not difficult to treat. However, if left untreated, chlamydia can lead to more serious health problems, including pelvic inflammatory  disease (PID). Some people have no symptoms (are asymptomatic), especially early in the infection. This condition is treated with antibiotic medicines. Using latex condoms correctly every time you have sex can help prevent chlamydia. This information is not intended to replace advice given to you by your health care provider. Make sure you discuss any questions you have with your health care provider. Document Revised: 07/02/2019 Document Reviewed: 07/02/2019 Elsevier Patient Education  2022 [employer].

## 2021-09-27 ENCOUNTER — Other Ambulatory Visit (INDEPENDENT_AMBULATORY_CARE_PROVIDER_SITE_OTHER): Payer: Self-pay | Admitting: Physician Assistant

## 2021-09-27 ENCOUNTER — Other Ambulatory Visit: Payer: Self-pay | Admitting: Family Medicine

## 2021-09-27 DIAGNOSIS — A749 Chlamydial infection, unspecified: Secondary | ICD-10-CM

## 2021-09-28 ENCOUNTER — Encounter: Payer: Self-pay | Admitting: *Deleted

## 2021-09-28 ENCOUNTER — Other Ambulatory Visit: Payer: Self-pay | Admitting: *Deleted

## 2021-09-28 DIAGNOSIS — A749 Chlamydial infection, unspecified: Secondary | ICD-10-CM

## 2021-09-28 MED ORDER — DOXYCYCLINE HYCLATE 100 MG PO CAPS
100.0000 mg | ORAL_CAPSULE | Freq: Two times a day (BID) | ORAL | 0 refills | Status: DC
Start: 2021-09-28 — End: 2021-12-21

## 2021-11-07 DIAGNOSIS — R030 Elevated blood-pressure reading, without diagnosis of hypertension: Secondary | ICD-10-CM | POA: Diagnosis not present

## 2021-11-19 ENCOUNTER — Telehealth: Payer: Medicaid Other | Admitting: Physician Assistant

## 2021-11-19 DIAGNOSIS — L905 Scar conditions and fibrosis of skin: Secondary | ICD-10-CM

## 2021-11-19 NOTE — Patient Instructions (Signed)
?  Jamie Moyer, thank you for joining [name], PA-C for today's virtual visit.  While this provider is not your primary care provider (PCP), if your PCP is located in our provider database this encounter information will be shared with them immediately following your visit. ? ?Consent: ?(Patient) [name] provided verbal consent for this virtual visit at the beginning of the encounter. ? ?Current Medications: ? ?Current Outpatient Medications:  ?  acetaminophen (TYLENOL) 325 MG tablet, Take 2 tablets (650 mg total) by mouth every 6 (six) hours as needed for mild pain (or temp > 100)., Disp: , Rfl:  ?  amitriptyline (ELAVIL) 25 MG tablet, Take 1 tablet nightly, Disp: 30 tablet, Rfl: 6 ?  Blood Pressure Monitoring DEVI, Monitor BP every few days., Disp: 1 Units, Rfl: 0 ?  busPIRone (BUSPAR) 5 MG tablet, Take 2.5 mg by mouth daily., Disp: , Rfl:  ?  doxycycline (VIBRAMYCIN) 100 MG capsule, Take 1 capsule (100 mg total) by mouth 2 (two) times daily., Disp: 14 capsule, Rfl: 0 ?  Ferrous Fumarate (HEMOCYTE - 106 MG FE) 324 (106 Fe) MG TABS tablet, Take 1 tablet (106 mg of iron total) by mouth every other day., Disp: 30 tablet, Rfl: 3 ?  fluticasone (FLONASE) 50 MCG/ACT nasal spray, Place 2 sprays into both nostrils daily., Disp: 16 mL, Rfl: 0 ?  loratadine (CLARITIN) 10 MG tablet, Take 10 mg by mouth daily as needed for allergies. , Disp: , Rfl: 1 ?  vitamin C (VITAMIN C) 500 MG tablet, Take 1 tablet (500 mg total) by mouth every other day., Disp: 30 tablet, Rfl: 3  ? ?Medications ordered in this encounter:  ?No orders of the defined types were placed in this encounter. ?  ? ?*If you need refills on other medications prior to your next appointment, please contact your pharmacy* ? ?Follow-Up: ?Call back or seek an in-person evaluation if the symptoms worsen or if the condition fails to improve as anticipated. ? ?Other Instructions ?Avoid heavy lifting or overexertion. ?Tylenol if needed for  pain. ?Heating pad may help if pain is dull and lasting longer. ?Contact your GYN ASAP to schedule in-person follow-up.  ? ? ?If you have been instructed to have an in-person evaluation today at a local Urgent Care facility, please use the link below. It will take you to a list of all of our available [facility], including address, phone number and hours of operation. Please do not delay care.  ?[facility] ? ?If you or a family member do not have a primary care provider, use the link below to schedule a visit and establish care. When you choose a [facility] primary care physician or advanced practice provider, you gain a long-term partner in health. ?Find a Primary Care Provider ? ?Learn more about [facility] in-office and virtual care options: ?[facility] - Get Care Now  ?

## 2021-11-19 NOTE — Progress Notes (Signed)
?Virtual Visit Consent  ? ?[name], you are scheduled for a virtual visit with a [facility] provider today. Just as with appointments in the office, your consent must be obtained to participate. Your consent will be active for this visit and any virtual visit you may have with one of our providers in the next 365 days. If you have a MyChart account, a copy of this consent can be sent to you electronically. ? ?As this is a virtual visit, video technology does not allow for your provider to perform a traditional examination. This may limit your provider's ability to fully assess your condition. If your provider identifies any concerns that need to be evaluated in person or the need to arrange testing (such as labs, EKG, etc.), we will make arrangements to do so. Although advances in technology are sophisticated, we cannot ensure that it will always work on either your end or our end. If the connection with a video visit is poor, the visit may have to be switched to a telephone visit. With either a video or telephone visit, we are not always able to ensure that we have a secure connection. ? ?By engaging in this virtual visit, you consent to the provision of healthcare and authorize for your insurance to be billed (if applicable) for the services provided during this visit. Depending on your insurance coverage, you may receive a charge related to this service. ? ?I need to obtain your verbal consent now. Are you willing to proceed with your visit today? TACHA HICKEY has provided verbal consent on 11/19/2021 for a virtual visit (video or telephone). Leeanne Rio, PA-C ? ?Date: 11/19/2021 10:41 AM ? ?Virtual Visit via Video Note  ? ?ILeeanne Rio, connected with  JAMYAH PEETZ  ([id number], [birth year]) on 11/19/21 at 10:30 AM EDT by a video-enabled telemedicine application and verified that I am speaking with the correct person using two identifiers. ? ?Location: ?Patient: Virtual Visit  Location Patient: Home ?Provider: Virtual Visit Location Provider: Home Office ?  ?I discussed the limitations of evaluation and management by telemedicine and the availability of in person appointments. The patient expressed understanding and agreed to proceed.   ? ?History of Present Illness: ?Jamie Moyer is a 21 y.o. who identifies as a female who was assigned female at birth, and is being seen today for occasional pain at site of her c-section scar when urinating over the past several days. Denies any overt abdominal pain, constipation, diarrhea, urinary urgency, frequency, hesitancy. Denies any trauma or injury. Works for [employer] so is moving a lot. Is concerned related to chlamydia exposure since she was exposed to this via her partner a few months ago, both treated with course of Doxycyline but no test of cure afterwards. Denies any vaginal irritation, discharge, dyspareunia. Partner without symptoms. LMP was a week ago.   ? ?HPI: HPI  ?Problems:  ?Patient Active Problem List  ? Diagnosis Date Noted  ? Appendicitis 12/24/2019  ? Gestational hypertension, third trimester 08/30/2019  ? Generalized anxiety disorder 04/25/2019  ? Tension headache 11/17/2015  ? Anxiety state 11/17/2015  ? Migraine without aura and without status migrainosus, not intractable 11/17/2015  ? Poor appetite 11/17/2015  ? Insomnia 11/17/2015  ?  ?Allergies:  ?Allergies  ?Allergen Reactions  ? Other   ?  Seasonal Allergies  ?  ? Cephalexin Rash  ? ?Medications:  ?Current Outpatient Medications:  ?  acetaminophen (TYLENOL) 325 MG tablet, Take 2 tablets (650 mg  total) by mouth every 6 (six) hours as needed for mild pain (or temp > 100)., Disp: , Rfl:  ?  amitriptyline (ELAVIL) 25 MG tablet, Take 1 tablet nightly, Disp: 30 tablet, Rfl: 6 ?  Blood Pressure Monitoring DEVI, Monitor BP every few days., Disp: 1 Units, Rfl: 0 ?  busPIRone (BUSPAR) 5 MG tablet, Take 2.5 mg by mouth daily., Disp: , Rfl:  ?  doxycycline (VIBRAMYCIN) 100 MG capsule,  Take 1 capsule (100 mg total) by mouth 2 (two) times daily., Disp: 14 capsule, Rfl: 0 ?  Ferrous Fumarate (HEMOCYTE - 106 MG FE) 324 (106 Fe) MG TABS tablet, Take 1 tablet (106 mg of iron total) by mouth every other day., Disp: 30 tablet, Rfl: 3 ?  fluticasone (FLONASE) 50 MCG/ACT nasal spray, Place 2 sprays into both nostrils daily., Disp: 16 mL, Rfl: 0 ?  loratadine (CLARITIN) 10 MG tablet, Take 10 mg by mouth daily as needed for allergies. , Disp: , Rfl: 1 ?  vitamin C (VITAMIN C) 500 MG tablet, Take 1 tablet (500 mg total) by mouth every other day., Disp: 30 tablet, Rfl: 3 ? ?Observations/Objective: ?Patient is well-developed, well-nourished in no acute distress.  ?Resting comfortably at home.  ?Head is normocephalic, atraumatic.  ?No labored breathing. ?Speech is clear and coherent with logical content.  ?Patient is alert and oriented at baseline.  ?No abdominal tenderness to palpation per patient.  ? ?Assessment and Plan: ?1. Scar pain ? ?At site of c-section performed 2 years ago. Only with straining. No urinary, bowel or vaginal symptoms/changes noted by patient. Asymptomatic at present. Likely scar tissue and adhesions getting pulled with certain straining. Supportive measures reviewed. She is to follow-up with her GYN for further assessment. Also test of cure after chlamydia treatment to be cautious.  She is to limit heavy lifting and exertion until in-person follow-up. ? ?Follow Up Instructions: ?I discussed the assessment and treatment plan with the patient. The patient was provided an opportunity to ask questions and all were answered. The patient agreed with the plan and demonstrated an understanding of the instructions.  A copy of instructions were sent to the patient via MyChart unless otherwise noted below.  ? ?The patient was advised to call back or seek an in-person evaluation if the symptoms worsen or if the condition fails to improve as anticipated. ? ?Time:  ?I spent 10 minutes with the patient  via telehealth technology discussing the above problems/concerns.   ? ?Leeanne Rio, PA-C ?

## 2021-11-24 ENCOUNTER — Other Ambulatory Visit: Payer: Self-pay | Admitting: Family Medicine

## 2021-11-24 DIAGNOSIS — O165 Unspecified maternal hypertension, complicating the puerperium: Secondary | ICD-10-CM

## 2021-11-24 MED ORDER — AMLODIPINE BESYLATE 5 MG PO TABS: 5.0000 mg | ORAL_TABLET | Freq: Every day | ORAL | 1 refills | Status: DC

## 2021-11-25 ENCOUNTER — Other Ambulatory Visit: Payer: Self-pay | Admitting: Family Medicine

## 2021-11-25 DIAGNOSIS — O165 Unspecified maternal hypertension, complicating the puerperium: Secondary | ICD-10-CM

## 2021-11-27 NOTE — Telephone Encounter (Signed)
Can we please get her scheduled in the next 1 month (or so) with an FM-OB if possible for hypertension management/follow up?  ? ?Thank you ?Dr Annia Friendly  ?

## 2021-12-01 ENCOUNTER — Ambulatory Visit: Payer: Medicaid Other

## 2021-12-06 ENCOUNTER — Other Ambulatory Visit (INDEPENDENT_AMBULATORY_CARE_PROVIDER_SITE_OTHER): Payer: Self-pay | Admitting: Neurology

## 2021-12-06 DIAGNOSIS — G43009 Migraine without aura, not intractable, without status migrainosus: Secondary | ICD-10-CM

## 2021-12-06 DIAGNOSIS — G44209 Tension-type headache, unspecified, not intractable: Secondary | ICD-10-CM

## 2021-12-06 DIAGNOSIS — F411 Generalized anxiety disorder: Secondary | ICD-10-CM

## 2021-12-08 ENCOUNTER — Ambulatory Visit: Payer: Medicaid Other

## 2021-12-20 NOTE — Progress Notes (Unsigned)
Subjective:  Patient ID: Jamie Moyer, female    DOB: Dec 12, 2000, 21 y.o.   MRN: [id number]  CC: New Patient  HPI:  [name] is a very pleasant 21 y.o. female who presents today to establish care.  High blood pressure: Patient takes amlodipine 5 mg every other day as advised by her OB/GYN.  She did have gestational hypertension in previous pregnancy.  Advised by her OB to get a PCP to manage elevated blood pressure.  Patient notes that her blood pressure was really low 1 time when taking her medication (100 over something).  She also has anxiety and feels that that may be contributing to her high blood pressure.  Hypertension does run on her mother side of the family.  Anxiety: Patient takes BuSpar 2.5 mg daily and is managed by psychiatry.  PMHx: Past Medical History:  Diagnosis Date   Anxiety    Depression    Marginal insertion of umbilical cord affecting management of mother in third trimester 09/04/2019   Mononucleosis    Precordial catch syndrome    Spleen enlargement    Supervision of normal first pregnancy, antepartum 03/20/2019    Nursing Staff Provider  Office Location  [facility] Dating  LMP 12/30/2018  Language  English Anatomy US  Normal, but limited d/t fetal position. F/U Scheduled  Flu Vaccine   [ ]  Received    ] Declined Genetic Screen  NIPS:Low risk girl   AFP:   First Screen:  Quad:    TDaP vaccine    [ ]  Received    Arly.Keller ] Declined Hgb A1C or  GTT Early  Third trimester   Rhogam  N/A   LAB RESULTS   Feeding Pla    Surgical Hx: Past Surgical History:  Procedure Laterality Date   CESAREAN SECTION N/A 09/05/2019   Procedure: CESAREAN SECTION;  Surgeon: Arly.Keller, MD;  Location: [facility];  Service: Obstetrics;  Laterality: N/A;   LAPAROSCOPIC APPENDECTOMY N/A 12/24/2019   Procedure: APPENDECTOMY LAPAROSCOPIC;  Surgeon: Allie Bossier, MD;  [facility]: [facility];  Service: General;  Laterality: N/A;   NO PAST SURGERIES      Family Hx: Family History  Problem  Relation Age of Onset   Migraines Mother    Panic disorder Mother    High blood pressure Mother    Healthy Father    Kidney disease Maternal Grandmother    Diabetes Other    Heart disease Other     Social Hx: Who lives at home: *** 12/21/2021  Who would speak for you about health care matters: *** 12/21/2021  Transportation: *** 12/21/2021 Important Relationships & Pets: *** 12/21/2021  Current Stressors: *** 12/21/2021 Work / Education:  *** 12/21/2021 Religious / Personal Beliefs: *** 12/21/2021 Interests / Fun: *** 12/21/2021 Other: *** 12/21/2021   Medications: Reviewed and updated.  Smoking status reviewed  Objective:  BP 134/84 Comment: no change with BP recheck  Pulse 95   Ht 4\' 11"  (1.499 m)   Wt 81 lb (36.7 kg)   SpO2 99%   BMI 16.36 kg/m  Vitals and nursing note reviewed  General: NAD, pleasant, able to participate in exam Cardiac: RRR, S1 S2 present. normal heart sounds, no murmurs. Respiratory: CTAB, normal effort, No wheezes, rales or rhonchi Abdomen: Normoactive bowel sounds, non-tender, non-distended, no hepatosplenomegaly Neuro: alert, no obvious focal deficits Psych: Normal affect and mood  Assessment & Plan:  High blood pressure Blood pressure 134/84 today.  Given possibility of whitecoat hypertension  or anxiety that is increasing blood pressure, opted to have ambulatory BP monitoring with pharmacy.  Advised not to take amlodipine until further notice.  Patient scheduled prior to leaving.  Generalized anxiety disorder Continue BuSpar 2.5 mg daily.  Managed by psychiatry.  Advised patient to have psychiatry forward any recent or new notes regarding medical care.  Therapy resources provided.  Return if symptoms worsen or fail to improve. Shelby Mattocks, DO 12/21/2021, 5:06 PM PGY-1, [facility]

## 2021-12-21 ENCOUNTER — Encounter: Payer: Self-pay | Admitting: Student

## 2021-12-21 ENCOUNTER — Ambulatory Visit: Payer: Medicaid Other

## 2021-12-21 ENCOUNTER — Ambulatory Visit (INDEPENDENT_AMBULATORY_CARE_PROVIDER_SITE_OTHER): Payer: Medicaid Other | Admitting: Student

## 2021-12-21 VITALS — BP 134/84 | HR 95 | Ht 59.0 in | Wt 81.0 lb

## 2021-12-21 DIAGNOSIS — F411 Generalized anxiety disorder: Secondary | ICD-10-CM

## 2021-12-21 DIAGNOSIS — I10 Essential (primary) hypertension: Secondary | ICD-10-CM | POA: Insufficient documentation

## 2021-12-21 NOTE — Patient Instructions (Signed)
It was great to see you today! Thank you for choosing [facility] for your primary care. Jamie Moyer was seen for establishing care, hypertension.  Today we addressed: Please stop taking your blood pressure medication for the time being.  We are getting you scheduled for ambulatory blood pressure monitoring with our pharmacist. Below, you will find resources for establishing with therapy.  Please tell your psychiatrist that you have established with [place], so they can send [place] their notes for changes to make regarding your medical care.  You are due for a Pap smear per my records.  Please schedule this with [place] or your OB in the near future.  If you haven't already, sign up for My Chart to have easy access to your labs results, and communication with your primary care physician.  We are checking some labs today. If they are abnormal, I will call you. If they are normal, I will send you a MyChart message (if it is active) or a letter in the mail. If you do not hear about your labs in the next 2 weeks, please call the office.   You should return to our clinic Return if symptoms worsen or fail to improve.  I recommend that you always bring your medications to each appointment as this makes it easy to ensure you are on the correct medications and helps [place] not miss refills when you need them.  Please arrive 15 minutes before your appointment to ensure smooth check in process.  We appreciate your efforts in making this happen.  Please call the clinic at [phone] if your symptoms worsen or you have any concerns.  Thank you for allowing me to participate in your care, Shelby Mattocks, DO 12/21/2021, 4:20 PM PGY-1, [facility]    Therapy and Counseling Resources Most providers on this list will take Medicaid. Patients with commercial insurance or Medicare should contact their insurance company to get a list of in network providers.  [facility] (spanish speaking therapist  available)(habla espanol)(take medicare and medicaid)  [address], [place], [place] [address], [place] [email or web address] [phone]  [facility]:Psychiatry and Counseling [address] [facility] [address] [place], [place] [address] [phone]  [facility]   [address] [place], [place] [address]      [phone]  [facility] (spanish available) [address]  [place], [place] [address] [phone]  [facility] (take [facility] and medicare) [address], [address]  [place], [place] [address]       [phone]     [facility] (virtual only) [phone]  [facility] [address], [place], [place] [phone]  [facility]:  [address] [place] [phone]  [facility]:  [address] [place] [place] [phone]  [facility] (under & uninsured) [address]   [place] [phone]    [email or web address]    [facility] [address]  [place]    [phone]  [facility] -[address]     Phone:  [phone]     [facility]-  [address]  [phone]   [facility] [address]      [place], [place] [phone]  [facility]: [address] [place], [place], [place]  [phone]   [facility] (Spanish therapist) [email or web address]  [address]   [place] [address]    [phone]    [facility]   [address],  [place], [place]  [phone]   [facility]  [address] [place]  [phone]  [facility]  [address] [place], [place]        [phone]  Open Access/Walk In Clinic under & uninsured  [facility]  [address] [place], [place]  [phone] Crisis [phone]  [facility],  (Spanish)   [address], [place]: [phone]) 8:30 - 12; 1 - 2:30  [facility] [employer],  [address] [place], [place]    [phone]):8:30 - 12; 2 - 3PM  [facility] [employer],  [address]  [place]; [phone]):   Mon - Fri 8 AM - 5 PM  [facility] [address] [place]  MWF 12:30 to 3:00 or call to schedule an appointment  [phone]  Specific Provider options Psychology Today  [email or web address].psychologytoday.com/us click on find a therapist  enter your zip code left side and select or tailor a therapist for your specific need.   [facility] Provider Directory [email or web address]  (Medicaid)   Follow all drop down to find a provider  Social Support program [facility] [phone] or [employer] [address], [place], [place] Recovery support and educational   24- Hour Availability:   [facility]  [address] [place], [place] [phone] Crisis [phone]  [facility] [phone]  [facility] [facility]  [phone]   [facility]  [phone] (after hours)  Therapeutic Alternative/Mobile Crisis   [phone]  [place] National Suicide Hotline  [phone] Len Childs)  Call 911 or go to emergency room  [facility]  [phone]);  [place] and [employer]  [phone]); [place], [place], [place], [place], [place], [place], [place]

## 2021-12-21 NOTE — Assessment & Plan Note (Signed)
Blood pressure 134/84 today.  Given possibility of whitecoat hypertension or anxiety that is increasing blood pressure, opted to have ambulatory BP monitoring with pharmacy.  Advised not to take amlodipine until further notice.  Patient scheduled prior to leaving.

## 2021-12-21 NOTE — Assessment & Plan Note (Signed)
Continue BuSpar 2.5 mg daily.  Managed by psychiatry.  Advised patient to have psychiatry forward any recent or new notes regarding medical care.  Therapy resources provided.

## 2021-12-22 ENCOUNTER — Encounter: Payer: Self-pay | Admitting: *Deleted

## 2022-01-04 ENCOUNTER — Ambulatory Visit (INDEPENDENT_AMBULATORY_CARE_PROVIDER_SITE_OTHER): Payer: Medicaid Other | Admitting: Pharmacist

## 2022-01-04 DIAGNOSIS — I10 Essential (primary) hypertension: Secondary | ICD-10-CM

## 2022-01-04 NOTE — Progress Notes (Signed)
   S:    Patient arrives in good spirits without assistance.  Presents to the clinic for ambulatory blood pressure evaluation.  Patient was referred and last seen by Primary Care Provider, Dr. Royal Piedra, on 12/21/21.   Patient reports her blood pressure has been fluctuating in the 130s/80s since pregnancy in 2021. She has a BP monitor at home and reports diastolic values to be over 80 after exercise.   Diagnosed with Hypertension in the year of 2023. Hx of gestational HTN in 2021.  Has a family Hx of HTN.  Medication compliance is reported to be good. History of migraine headaches - taking amitriptyline 25mg  QHS  Discussed procedure for wearing the monitor and gave patient written instructions. Monitor was placed on non-dominant arm with instructions to return in the morning.   Current BP Medications include:  amlodipine 5 mg (currently holding)  Day - 2  Unable to return monitor at 8:30 due to car trouble.  Patient contacted office to make aware.  She was stressed to report later in the day.  She returned the meter at ~ 2:00PM on 01/05/2022    O:  Physical Exam Constitutional:      Appearance: Normal appearance. She is normal weight.  Pulmonary:     Effort: Pulmonary effort is normal.     Breath sounds: Normal breath sounds.  Neurological:     Mental Status: She is alert.  Psychiatric:        Mood and Affect: Mood normal.        Behavior: Behavior normal.        Thought Content: Thought content normal.  Review of Systems  All other systems reviewed and are negative.   Last 3 Office BP readings: BP Readings from Last 3 Encounters:  12/21/21 134/84  09/24/21 (!) 146/83  09/21/21 120/80   BMI: 16.72 kg/m2  Basic Metabolic Panel    Component Value Date/Time   NA 136 05/04/2020 1621   NA 138 07/25/2019 1606   K 3.9 05/04/2020 1621   CL 104 05/04/2020 1621   CO2 22 05/04/2020 1621   GLUCOSE 91 05/04/2020 1621   BUN 11 05/04/2020 1621   BUN 3 (L) 07/25/2019 1606    CREATININE 0.61 05/04/2020 1621   CREATININE 0.71 01/16/2018 0000   CALCIUM 9.4 05/04/2020 1621   GFRNONAA >60 05/04/2020 1621   GFRAA >60 12/23/2019 1652      ABPM Study Data: Arm Placement left arm  Overall Mean 24hr BP:   122/77 mmHg  HR: 81  Daytime Mean BP:  122/78 mmHg  HR: 85  Nighttime Mean BP:  121/76 mmHg  HR: 68  Dipping Pattern: No.  Sys:   0.4%   Dia: 2%   [normal dipping ~10-20%]  Non-hypertensive ABPM thresholds: daytime BP <125/75 mmHg, sleeptime BP <120/70 mmHg      A/P: History of elevated isolated blood pressure readings.  24-hour ambulatory blood pressure demonstrates controlled blood pressure < 130/80.  She has an average blood pressure of 122/78 mmHg, and a nocturnal dipping pattern that is  minimal likely related to poor sleep with young child .  - No medication at this time. Patent was relieved and a bit surprised due to her being so "stressed" with her car malfunction.     Results reviewed and written information provided.  Total time in face-to-face counseling 22 minutes.   F/U Clinic Visit with Dr. 02/22/2020.  Patient seen with Royal Piedra, PharmD Candidate.

## 2022-01-04 NOTE — Patient Instructions (Signed)
Blood Pressure Activity Diary Time Lying down/ Sleeping Walking/ Exercise Stressed/ Angry Headache/ Pain Dizzy  9 AM       10 AM       11 AM       12 PM       1 PM       2 PM       Time Lying down/ Sleeping Walking/ Exercise Stressed/ Angry Headache/ Pain Dizzy  3 PM       4 PM        5 PM       6 PM       7 PM       8 PM       Time Lying down/ Sleeping Walking/ Exercise Stressed/ Angry Headache/ Pain Dizzy  9 PM       10 PM       11 PM       12 AM       1 AM       2 AM       3 AM       Time Lying down/ Sleeping Walking/ Exercise Stressed/ Angry Headache/ Pain Dizzy  4 AM       5 AM       6 AM       7 AM       8 AM       9 AM       10 AM        Time you woke up: _________                  Time you went to sleep:__________  Come back tomorrow at 8:30 AM to have the monitor removed Call the [facility] if you have any questions before then ([phone])  Wearing the Blood Pressure Monitor The cuff will inflate every 20 minutes during the day and every 30 minutes while you sleep. Your blood pressure readings will NOT display after cuff inflation Fill out the blood pressure-activity diary during the day, especially during activities that may affect your reading -- such as exercise, stress, walking, taking your blood pressure medications  Important things to know: Avoid taking the monitor off for the next 24 hours, unless it causes you discomfort or pain. Do NOT get the monitor wet and do NOT dry to clean the monitor with any cleaning products. Do NOT put the monitor on anyone else's arm. When the cuff inflates, avoid excess movement. Let the cuffed arm hang loosely, slightly away from the body. Avoid flexing the muscles or moving the hand/fingers. When you go to sleep, make sure that the hose is not kinked. Remember to fill out the blood pressure activity diary. If you experience severe pain or unusual pain (not associated with getting your blood pressure  checked), remove the monitor.  Troubleshooting:  Code  Troubleshooting   1  Check cuff position, tighten cuff   2, 3  Remain still during reading   4, 87  Check air hose connections and make sure cuff is tight   85, 89  Check hose connections and make tubing is not crimped   86  Push START/STOP to restart reading   88, 91  Retry by pushing START/STOP   90  Replace batteries. If problem persists, remove monitor and bring back to   clinic at follow up   97, 98, 99  Service required - Remove monitor and bring back to clinic   at follow up    

## 2022-01-05 ENCOUNTER — Encounter: Payer: Self-pay | Admitting: Pharmacist

## 2022-01-05 NOTE — Progress Notes (Signed)
Reviewed: I agree with Dr. Koval's documentation and management. 

## 2022-01-05 NOTE — Assessment & Plan Note (Signed)
History of elevated isolated blood pressure readings.  24-hour ambulatory blood pressure demonstrates controlled blood pressure < 130/80.  She has an average blood pressure of 122/78 mmHg, and a nocturnal dipping pattern that is minimal likely related to poor sleep with young child.  - No medication at this time. Patent was relieved and a bit surprised due to her being so "stressed" with her car malfunction.

## 2022-02-23 ENCOUNTER — Other Ambulatory Visit (INDEPENDENT_AMBULATORY_CARE_PROVIDER_SITE_OTHER): Payer: Self-pay

## 2022-03-10 ENCOUNTER — Encounter (HOSPITAL_COMMUNITY): Payer: Self-pay | Admitting: *Deleted

## 2022-03-10 ENCOUNTER — Other Ambulatory Visit: Payer: Self-pay

## 2022-03-10 ENCOUNTER — Ambulatory Visit (HOSPITAL_COMMUNITY)
Admission: EM | Admit: 2022-03-10 | Discharge: 2022-03-10 | Disposition: A | Discharge status: Home / Self Care | Payer: Medicaid Other | Attending: Family Medicine | Admitting: Family Medicine

## 2022-03-10 ENCOUNTER — Ambulatory Visit (INDEPENDENT_AMBULATORY_CARE_PROVIDER_SITE_OTHER): Payer: Medicaid Other

## 2022-03-10 DIAGNOSIS — R079 Chest pain, unspecified: Secondary | ICD-10-CM | POA: Diagnosis not present

## 2022-03-10 DIAGNOSIS — R0789 Other chest pain: Secondary | ICD-10-CM

## 2022-03-10 MED ORDER — IBUPROFEN 600 MG PO TABS: 600.0000 mg | ORAL_TABLET | Freq: Three times a day (TID) | ORAL | 0 refills | Status: DC | PRN

## 2022-03-10 MED ORDER — KETOROLAC TROMETHAMINE 30 MG/ML IJ SOLN
30.0000 mg | Freq: Once | INTRAMUSCULAR | Status: AC
  Administered 2022-03-10: 30 mg via INTRAMUSCULAR

## 2022-03-10 MED ORDER — KETOROLAC TROMETHAMINE 30 MG/ML IJ SOLN
INTRAMUSCULAR | Status: AC
  Filled 2022-03-10: qty 1

## 2022-03-10 NOTE — ED Provider Notes (Addendum)
[facility]    CSN: [id number] Arrival date & time: 03/10/22  1454      History   Chief Complaint Chief Complaint  Patient presents with   Muscle Pain    HPI Jamie Moyer is a 21 y.o. female.    Muscle Pain   Here for pain in her upper chest in the center.  She works at [employer] and yesterday a 60 pound box came down the shoot and struck her in her upper chest.  Ever since then it has been bothering her some.  No cough and no fever and no malaise.  Last menstrual cycle was August 20 Past Medical History:  Diagnosis Date   Anxiety    Depression    Marginal insertion of umbilical cord affecting management of mother in third trimester 09/04/2019   Mononucleosis    Precordial catch syndrome    Spleen enlargement    Supervision of normal first pregnancy, antepartum 03/20/2019    Nursing Staff Provider  Office Location  [facility] Dating  LMP 12/30/2018  Language  English Anatomy US  Normal, but limited d/t fetal position. F/U Scheduled  Flu Vaccine   [ ]  Received    ] Declined Genetic Screen  NIPS:Low risk girl   AFP:   First Screen:  Quad:    TDaP vaccine    [ ]  Received    Arly.Keller ] Declined Hgb A1C or  GTT Early  Third trimester   Rhogam  N/A   LAB RESULTS   Feeding Pla    Patient Active Problem List   Diagnosis Date Noted   High blood pressure 12/21/2021   Gestational hypertension, third trimester 08/30/2019   Generalized anxiety disorder 04/25/2019   Tension headache 11/17/2015   Migraine without aura and without status migrainosus, not intractable 11/17/2015    Past Surgical History:  Procedure Laterality Date   CESAREAN SECTION N/A 09/05/2019   Procedure: CESAREAN SECTION;  Surgeon: 01/17/2016, MD;  Location: [facility];  Service: Obstetrics;  Laterality: N/A;   LAPAROSCOPIC APPENDECTOMY N/A 12/24/2019   Procedure: APPENDECTOMY LAPAROSCOPIC;  Surgeon: Allie Bossier, MD;  Location: [facility] OR;  Service: General;  Laterality: N/A;   NO PAST SURGERIES      OB  History     Gravida  1   Para  1   Term      Preterm  1   AB      Living  1      SAB      IAB      Ectopic      Multiple  0   Live Births  1            Home Medications    Prior to Admission medications   Medication Sig Start Date End Date Taking? Authorizing Provider  ibuprofen (ADVIL) 600 MG tablet Take 1 tablet (600 mg total) by mouth every 8 (eight) hours as needed (pain). 03/10/22  Yes [facility], MD  acetaminophen (TYLENOL) 325 MG tablet Take 2 tablets (650 mg total) by mouth every 6 (six) hours as needed for mild pain (or temp > 100). 12/24/19   Maczis, Zenia Resides, PA-C  amitriptyline (ELAVIL) 25 MG tablet TAKE 1 TABLET BY MOUTH EVERY NIGHT 12/07/21   Elmer Sow, MD  amLODipine (NORVASC) 5 MG tablet Take 1 tablet (5 mg total) by mouth daily. Patient not taking: Reported on 01/04/2022 11/24/21   01/06/2022, DO  Blood Pressure Monitoring DEVI Monitor  BP every few days. 09/24/21   Allayne Stack, DO  busPIRone (BUSPAR) 5 MG tablet Take 2.5 mg by mouth daily. 07/30/19   [provider]  Ferrous Fumarate (HEMOCYTE - 106 MG FE) 324 (106 Fe) MG TABS tablet Take 1 tablet (106 mg of iron total) by mouth every other day. Patient not taking: Reported on 01/04/2022 09/08/19   Raelyn Mora, CNM  fluticasone [facility]) 50 MCG/ACT nasal spray Place 2 sprays into both nostrils daily. 11/15/20   Rushie Chestnut, PA-C  loratadine (CLARITIN) 10 MG tablet Take 10 mg by mouth daily as needed for allergies.  11/07/15   [provider]  vitamin C (VITAMIN C) 500 MG tablet Take 1 tablet (500 mg total) by mouth every other day. 09/08/19   Raelyn Mora, CNM    Family History Family History  Problem Relation Age of Onset   Migraines Mother    Panic disorder Mother    High blood pressure Mother    Healthy Father    Kidney disease Maternal Grandmother    Diabetes Other    Heart disease Other     Social History Social History   Tobacco Use    Smoking status: Never    Passive exposure: Never   Smokeless tobacco: Never  Vaping Use   Vaping Use: Some days  Substance Use Topics   Alcohol use: Not Currently    Comment: <1 drink per week   Drug use: Yes    Frequency: 1.0 times per week    Types: Marijuana     Allergies   Cephalexin   Review of Systems Review of Systems   Physical Exam Triage Vital Signs ED Triage Vitals  Enc Vitals Group     BP 03/10/22 1533 105/88     Pulse Rate 03/10/22 1533 99     Resp 03/10/22 1533 18     Temp 03/10/22 1533 98.5 F (36.9 C)     Temp src --      SpO2 03/10/22 1533 100 %     Weight --      Height --      Head Circumference --      Peak Flow --      Pain Score 03/10/22 1529 4     Pain Loc --      Pain Edu? --      Excl. in GC? --    No data found.  Updated Vital Signs BP 105/88   Pulse 99   Temp 98.5 F (36.9 C)   Resp 18   LMP 03/07/2022   SpO2 100%   Visual Acuity Right Eye Distance:   Left Eye Distance:   Bilateral Distance:    Right Eye Near:   Left Eye Near:    Bilateral Near:     Physical Exam Vitals reviewed.  Constitutional:      General: She is not in acute distress.    Appearance: She is not ill-appearing, toxic-appearing or diaphoretic.  HENT:     Mouth/Throat:     Mouth: Mucous membranes are moist.  Cardiovascular:     Rate and Rhythm: Normal rate and regular rhythm.  Pulmonary:     Effort: Pulmonary effort is normal.     Breath sounds: Normal breath sounds.  Chest:     Chest wall: Tenderness (Over the manubrium and the manubrial sternal joint.  No deformity or ecchymosis) present.  Neurological:     General: No focal deficit present.     Mental Status:  She is alert and oriented to person, place, and time.  Psychiatric:        Behavior: Behavior normal.      UC Treatments / Results  Labs (all labs ordered are listed, but only abnormal results are displayed) Labs Reviewed - No data to display  EKG   Radiology DG Chest 2  View  Result Date: 03/10/2022 CLINICAL DATA:  Sternal pain, trauma EXAM: CHEST - 2 VIEW COMPARISON:  09/25/2015 FINDINGS: The heart size and mediastinal contours are within normal limits. Both lungs are clear. The visualized skeletal structures are unremarkable. IMPRESSION: No active cardiopulmonary disease. Electronically Signed   By: Jasmine Pang M.D.   On: 03/10/2022 16:14    Procedures Procedures (including critical care time)  Medications Ordered in UC Medications  ketorolac (TORADOL) 30 MG/ML injection 30 mg (has no administration in time range)    Initial Impression / Assessment and Plan / UC Course  I have reviewed the triage vital signs and the nursing notes.  Pertinent labs & imaging results that were available during my care of the patient were reviewed by me and considered in my medical decision making (see chart for details).     X-ray is benign without fracture.  Pain relief provided. Final Clinical Impressions(s) / UC Diagnoses   Final diagnoses:  Chest wall pain     Discharge Instructions      Your x-ray did not show any broken bones  You have been given a shot of Toradol 30 mg today.  Take ibuprofen 600 mg--1 tab every 8 hours as needed for pain.       ED Prescriptions     Medication Sig Dispense Auth. Provider   ibuprofen (ADVIL) 600 MG tablet Take 1 tablet (600 mg total) by mouth every 8 (eight) hours as needed (pain). 21 tablet Neri Vieyra, Janace Aris, MD      I have reviewed the PDMP during this encounter.   Zenia Resides, MD 03/10/22 1622    Zenia Resides, MD 03/10/22 [phone]

## 2022-03-10 NOTE — ED Triage Notes (Addendum)
Pt reports muscle pain started yesterday. Pt reports being sore today. Pt has not tried any OTC for pain. PT lifts 70's lbs

## 2022-03-10 NOTE — Discharge Instructions (Addendum)
Your x-ray did not show any broken bones  You have been given a shot of Toradol 30 mg today.  Take ibuprofen 600 mg--1 tab every 8 hours as needed for pain.

## 2022-04-12 ENCOUNTER — Encounter: Payer: Self-pay | Admitting: *Deleted

## 2022-04-23 ENCOUNTER — Encounter (INDEPENDENT_AMBULATORY_CARE_PROVIDER_SITE_OTHER): Payer: Self-pay | Admitting: Neurology

## 2022-04-23 ENCOUNTER — Ambulatory Visit (INDEPENDENT_AMBULATORY_CARE_PROVIDER_SITE_OTHER): Payer: Medicaid Other | Admitting: Neurology

## 2022-04-23 VITALS — BP 120/70 | HR 93 | Wt 77.5 lb

## 2022-04-23 DIAGNOSIS — F411 Generalized anxiety disorder: Secondary | ICD-10-CM

## 2022-04-23 DIAGNOSIS — G44209 Tension-type headache, unspecified, not intractable: Secondary | ICD-10-CM

## 2022-04-23 DIAGNOSIS — G43009 Migraine without aura, not intractable, without status migrainosus: Secondary | ICD-10-CM | POA: Diagnosis not present

## 2022-04-23 MED ORDER — AMITRIPTYLINE HCL 25 MG PO TABS: ORAL_TABLET | ORAL | 6 refills | Status: DC

## 2022-04-23 NOTE — Progress Notes (Signed)
Patient: Jamie Moyer MRN: [id number] Sex: female DOB: [birth year]  Provider: Keturah Shavers, MD Location of Care: [facility]  Note type: Routine return visit  Referral Source: Ivory Broad, MD History from: mother, patient, referring office, and [facility] chart Chief Complaint: routine follow up headache  History of Present Illness: Jamie Moyer is a 21 y.o. female is here for follow-up management of headaches. She has a diagnosis of chronic migraine and tension type headaches with some anxiety and mood issues, currently on moderate dose of amitriptyline at 25 mg every night with good headache control. She was last seen in March and she was recommended to continue the same dose of medication and return in a few months to see how she does. She has been doing very well without having any frequent headaches over the past few months and recently she tried to take her off of medication.  She was off of medication for 5 days last week and she started having some symptoms of headache and feeling dizzy so she started medication again 5 days ago and doing better now. She usually sleeps well without any difficulty and with no awakening headaches.  She has normal appetite.  She is taking low-dose BuSpar and has been doing fairly well in terms of anxiety and mood issues.  Review of Systems: Review of system as per HPI, otherwise negative.  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 F/U Scheduled  Flu Vaccine   [ ]  Received    ] Declined Genetic Screen  NIPS:Low risk girl   AFP:   First  Screen:  Quad:    TDaP vaccine    [ ]  Received    Arly.Keller ] Declined Hgb A1C or  GTT Early  Third trimester   Rhogam  N/A   LAB RESULTS   Feeding Pla   Hospitalizations: No., Head Injury: No., Nervous System Infections: No., Immunizations up to date: Yes.     Surgical History Past Surgical History:  Procedure Laterality Date   CESAREAN SECTION N/A 09/05/2019   Procedure: CESAREAN SECTION;  Surgeon: Arly.Keller, MD;  Location: [facility];  Service: Obstetrics;  Laterality: N/A;   LAPAROSCOPIC APPENDECTOMY N/A 12/24/2019   Procedure: APPENDECTOMY LAPAROSCOPIC;  Surgeon: Allie Bossier, MD;  Location: [facility] OR;  Service: General;  Laterality: N/A;   NO PAST SURGERIES      Family History family history includes Diabetes in an other family member; Healthy in her father; Heart disease in an other family member; High blood pressure in her mother; Kidney disease in her maternal grandmother; Migraines in her mother; Panic disorder in her mother.   Social History Social History   Socioeconomic History   Marital status: Single    Spouse name: Not on file   Number of children: Not on file   Years of education: Not on file   Highest education level: High school graduate  Occupational History   Occupation: Online Business  Tobacco Use   Smoking status: Never    Passive exposure: Never   Smokeless tobacco: Never  Vaping Use   Vaping Use: Some days  Substance and Sexual Activity   Alcohol use: Not Currently    Comment: <1 drink per week   Drug use: Yes    Frequency: 1.0 times per week    Types: Marijuana   Sexual activity: Yes    Partners: Male    Birth control/protection: Condom  Other Topics Concern   Not on file  Social History Narrative   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   Lives with her mother. She has adult siblings that do no live in the home.    Social Determinants of Health   Financial Resource Strain: Unknown (03/20/2019)   Overall Financial Resource  Strain (CARDIA)    Difficulty of Paying Living Expenses: Patient refused  Food Insecurity: Unknown (03/20/2019)   Hunger Vital Sign    Worried About [place] in the Last Year: Patient refused    [place] in the Last Year: Patient refused  Transportation Needs: Unknown (03/20/2019)   [place] - Hydrologist (Medical): Patient refused    Lack of Transportation (Non-Medical): Patient refused  Physical Activity: Unknown (03/20/2019)   Exercise Vital Sign    Days of Exercise per Week: Patient refused    Minutes of Exercise per Session: Patient refused  Stress: Unknown (03/20/2019)   [place] of Stress : Patient refused  Social Connections: Unknown (03/20/2019)   Social Connection and Isolation Panel [NHANES]    Frequency of Communication with Friends and Family: Patient refused    Frequency of Social Gatherings with Friends and Family: Patient refused    Attends Religious Services: Patient refused    Marine scientist or Organizations: Patient refused    Attends Archivist Meetings: Patient refused    Marital Status: Patient refused     Allergies  Allergen Reactions   Cephalexin Rash    Physical Exam BP 120/70   Pulse 93   Wt 77 lb 8 oz (35.2 kg)   BMI 15.65 kg/m  Gen: Awake, alert, not in distress Skin: No rash, No neurocutaneous stigmata. HEENT: Normocephalic, no dysmorphic features, no conjunctival injection, nares patent, mucous membranes moist, oropharynx clear. Neck: Supple, no meningismus. No focal tenderness. Resp: Clear to auscultation bilaterally CV: Regular rate, normal S1/S2, no murmurs, no rubs Abd: BS present, abdomen soft, non-tender, non-distended. No hepatosplenomegaly or mass Ext: Warm and well-perfused. No deformities, no muscle wasting, ROM full.  Neurological Examination: MS: Awake, alert, interactive. Normal eye contact,  answered the questions appropriately, speech was fluent,  Normal comprehension.  Attention and concentration were normal. Cranial Nerves: Pupils were equal and reactive to light ( 5-82mm);  normal fundoscopic exam with sharp discs, visual field full with confrontation test; EOM normal, no nystagmus; no ptsosis, no double vision, intact facial sensation, face symmetric with full strength of facial muscles, hearing intact to finger rub bilaterally, palate elevation is symmetric, tongue protrusion is symmetric with full movement to both sides.  Sternocleidomastoid and trapezius are with normal strength. Tone-Normal Strength-Normal strength in all muscle groups DTRs-  Biceps Triceps Brachioradialis Patellar Ankle  R 2+ 2+ 2+ 2+ 2+  L 2+ 2+ 2+ 2+ 2+   Plantar responses flexor bilaterally, no clonus noted Sensation: Intact to light touch, temperature, vibration, Romberg negative. Coordination: No dysmetria on FTN test. No difficulty with balance. Gait: Normal walk and run. Tandem  gait was normal. Was able to perform toe walking and heel walking without difficulty.   Assessment and Plan 1. Migraine without aura and without status migrainosus, not intractable   2. Anxiety state   3. Tension headache     This is a 21 year old female with episodes of chronic migraine and tension type headaches with good control on moderate dose of amitriptyline at 25 mg every night with no side effects except for occasional drowsiness.  She has no focal findings on her neurological examination and she was not tolerating being off of medication for more than 5 days since she was getting more frequent headaches. Recommend to continue the same dose of amitriptyline at 25 mg every night for now although if at some point she would like to try to be on lower dose of medication, I would recommend to try half a tablet every night for 1 week and see how she does but she should not stop the medication. She needs to have more  hydration with adequate sleep and limited screen time She may start taking dietary supplements including co-Q10 and vitamin B2 or B complex that may help her with the headaches and she might be able to take herself off of medication earlier She may take occasional Tylenol or ibuprofen for moderate to severe headache She will call my office if she develops more frequent headaches otherwise I would like to see her in 6 months for follow-up visit.  She and her mother understood and agreed with the plan.   Meds ordered this encounter  Medications   amitriptyline (ELAVIL) 25 MG tablet    Sig: TAKE 1 TABLET BY MOUTH EVERY NIGHT    Dispense:  30 tablet    Refill:  6   No orders of the defined types were placed in this encounter.

## 2022-04-23 NOTE — Patient Instructions (Signed)
Continue taking amitriptyline at 1 tablet every night Start taking dietary supplements such as co-Q10 and vitamin B complex Continue with more hydration and adequate sleep Return in 6 months for follow-up visit

## 2022-06-22 ENCOUNTER — Telehealth: Payer: Medicaid Other | Admitting: Family

## 2022-06-22 DIAGNOSIS — R6882 Decreased libido: Secondary | ICD-10-CM | POA: Diagnosis not present

## 2022-06-22 NOTE — Progress Notes (Signed)
Virtual Visit Consent   Jamie Moyer, you are scheduled for a virtual visit with a [facility] provider today. Just as with appointments in the office, your consent must be obtained to participate. Your consent will be active for this visit and any virtual visit you may have with one of our providers in the next 365 days. If you have a MyChart account, a copy of this consent can be sent to you electronically.  As this is a virtual visit, video technology does not allow for your provider to perform a traditional examination. This may limit your provider's ability to fully assess your condition. If your provider identifies any concerns that need to be evaluated in person or the need to arrange testing (such as labs, EKG, etc.), we will make arrangements to do so. Although advances in technology are sophisticated, we cannot ensure that it will always work on either your end or our end. If the connection with a video visit is poor, the visit may have to be switched to a telephone visit. With either a video or telephone visit, we are not always able to ensure that we have a secure connection.  By engaging in this virtual visit, you consent to the provision of healthcare and authorize for your insurance to be billed (if applicable) for the services provided during this visit. Depending on your insurance coverage, you may receive a charge related to this service.  I need to obtain your verbal consent now. Are you willing to proceed with your visit today? Jamie Moyer has provided verbal consent on 06/22/2022 for a virtual visit (video or telephone). Jamie Rodney, FNP  Date: 06/22/2022 6:38 PM  Virtual Visit via Video Note   I, Jamie Moyer, connected with  Jamie Moyer  ([id number], 10-25-2000) on 06/22/22 at  6:45 PM EST by a video-enabled telemedicine application and verified that I am speaking with the correct person using two identifiers.  Location: Patient: Virtual Visit Location Patient:  Home Provider: Virtual Visit Location Provider: Home Office   I discussed the limitations of evaluation and management by telemedicine and the availability of in person appointments. The patient expressed understanding and agreed to proceed.    History of Present Illness: Jamie Moyer is a 21 y.o. who identifies as a female who was assigned female at birth, and is being seen today for decreased sex drive that she noticed over the several months. She reports she has been starting and stopping her amitriptyline.    HPI: HPI  Problems:  Patient Active Problem List   Diagnosis Date Noted   High blood pressure 12/21/2021   Gestational hypertension, third trimester 08/30/2019   Generalized anxiety disorder 04/25/2019   Tension headache 11/17/2015   Migraine without aura and without status migrainosus, not intractable 11/17/2015    Allergies:  Allergies  Allergen Reactions   Cephalexin Rash   Medications:  Current Outpatient Medications:    amitriptyline (ELAVIL) 25 MG tablet, TAKE 1 TABLET BY MOUTH EVERY NIGHT, Disp: 30 tablet, Rfl: 6   Blood Pressure Monitoring DEVI, Monitor BP every few days. (Patient not taking: Reported on 04/23/2022), Disp: 1 Units, Rfl: 0   busPIRone (BUSPAR) 5 MG tablet, Take 2.5 mg by mouth daily. (Patient not taking: Reported on 06/22/2022), Disp: , Rfl:    Ferrous Fumarate (HEMOCYTE - 106 MG FE) 324 (106 Fe) MG TABS tablet, Take 1 tablet (106 mg of iron total) by mouth every other day. (Patient not taking: Reported on 06/22/2022), Disp: 30  tablet, Rfl: 3   fluticasone (FLONASE) 50 MCG/ACT nasal spray, Place 2 sprays into both nostrils daily. (Patient not taking: Reported on 06/22/2022), Disp: 16 mL, Rfl: 0   loratadine (CLARITIN) 10 MG tablet, Take 10 mg by mouth daily as needed for allergies.  (Patient not taking: Reported on 06/22/2022), Disp: , Rfl: 1   vitamin C (VITAMIN C) 500 MG tablet, Take 1 tablet (500 mg total) by mouth every other day. (Patient not  taking: Reported on 06/22/2022), Disp: 30 tablet, Rfl: 3  Observations/Objective: Patient is well-developed, well-nourished in no acute distress.  Resting comfortably  at home.  Head is normocephalic, atraumatic.  No labored breathing.  Speech is clear and coherent with logical content.  Patient is alert and oriented at baseline.    Assessment and Plan: 1. Decreased libido  Education provider Discussed importance of stress management  Recommend taking amitriptyline every day for the next several months instead of sporadically If continues may need to change  Recommend follow up with PCP    Follow Up Instructions: I discussed the assessment and treatment plan with the patient. The patient was provided an opportunity to ask questions and all were answered. The patient agreed with the plan and demonstrated an understanding of the instructions.  A copy of instructions were sent to the patient via MyChart unless otherwise noted below.     The patient was advised to call back or seek an in-person evaluation if the symptoms worsen or if the condition fails to improve as anticipated.  Time:  I spent 11 minutes with the patient via telehealth technology discussing the above problems/concerns.    Jamie Rodney, FNP

## 2022-06-30 NOTE — Progress Notes (Signed)
    SUBJECTIVE:   CHIEF COMPLAINT / HPI:   Lightheadedness Intermittent, lasts a few minutes, occurs randomly not even every week.  Denies nausea, vomiting, blurry vision or any changes in vision.  When it happens she sits down and drinks water which helps. She can feel lightheaded when sitting or standing. She is currently taking amitriptyline every night which she thinks makes her tired the next morning. Works a very active job at a [employer]. She doesn't eat breakfast, only eats snacks (chips, hot pocket) through the day and then dinner. She is worried it could be something very bad and says she has very bad anxiety. She is currently on her period.   Anxiety Patient states that she feels her anxiety is under control currently.  She is no longer taking BuSpar and does not want to take it.  States she may be interested in talking to someone in the future for her anxiety.  PERTINENT  PMH / PSH: Headaches, GAD  OBJECTIVE:   Vitals:   07/01/22 0825 07/01/22 0906  BP: 120/78   Pulse: (!) 121 80  SpO2: 98%      General: NAD, pleasant, able to participate in exam Cardiac: RRR, no murmurs. Respiratory: CTAB, normal effort, No wheezes, rales or rhonchi Skin: warm and dry, no rashes noted Neuro: alert, cranial nerves II through XII intact, sensation intact, finger-nose-finger test normal Psych: Normal affect and mood  ASSESSMENT/PLAN:   Lightheadedness I am reassured by patient's normal neuroexam and CT last February which showed no intracranial process.  I do not think symptoms are due to orthostatic hypotension as she feels lightheaded even when sitting at times.  Lightheadedness could be due to the fact that patient does not eat breakfast, only snacks through the day and is very active at work. We discussed healthier eating habits: -I recommend eating breakfast, snacks, lunch, and dinner every day.  Always make sure you are getting a protein and a carbohydrate ((rice, potato, bread,  pasta) here are some examples -Breakfast: Scrambled egg and toast, [place] yogurt with granola -Snack/lunch: Hard-boiled egg, string cheese, sandwich with lunch meat, carrot sticks, handful of nuts, whole fresh fruit -Dinner: Make sure you are getting a protein, carbohydrate and vegetable -STAY HYDRATED with water   We also discussed that her iron has been low in the past so we will check a CBC today to make sure anemia is not causing her symptoms.  She is currently on her period so pregnancy is not the cause of her lightheadedness.  Generalized anxiety disorder Patient was initially tachycardic into the 120s which resolved.  This is likely due to anxiety.  Patient does not wish to take medication at this time.  Therapy and counseling resources provided.   Patient to return if she would like to further discuss anxiety or if lightheadedness does not improve or worsens  Dr. Erick Alley, DO [facility]

## 2022-07-01 ENCOUNTER — Encounter: Payer: Self-pay | Admitting: Student

## 2022-07-01 ENCOUNTER — Ambulatory Visit (INDEPENDENT_AMBULATORY_CARE_PROVIDER_SITE_OTHER): Payer: Medicaid Other | Admitting: Student

## 2022-07-01 VITALS — BP 120/78 | HR 80 | Ht 59.0 in | Wt 83.0 lb

## 2022-07-01 DIAGNOSIS — R42 Dizziness and giddiness: Secondary | ICD-10-CM | POA: Diagnosis not present

## 2022-07-01 DIAGNOSIS — F411 Generalized anxiety disorder: Secondary | ICD-10-CM | POA: Diagnosis not present

## 2022-07-01 NOTE — Assessment & Plan Note (Signed)
Patient was initially tachycardic into the 120s which resolved.  This is likely due to anxiety.  Patient does not wish to take medication at this time.  Therapy and counseling resources provided.

## 2022-07-01 NOTE — Patient Instructions (Addendum)
It was great to see you! Thank you for allowing me to participate in your care!   Our plans for today:  -I recommend eating breakfast, snacks, lunch, and dinner every day.  Always make sure you are getting a protein and a carbohydrate ((rice, potato, bread, pasta) here are some examples -Breakfast: Scrambled egg and toast, [place] yogurt with granola -Snack/lunch: Hard-boiled egg, string cheese, sandwich with lunch meat, carrot sticks, handful of nuts, whole fresh fruit -Dinner: Make sure you are getting a protein, carbohydrate and vegetable -STAY HYDRATED with water   We are checking some labs today, I will call you if they are abnormal will send you a MyChart message or a letter if they are normal.  If you do not hear about your labs in the next 2 weeks please let us know.  Take care and seek immediate care sooner if you develop any concerns.   Dr. Precious Gilding, DO [facility]   Therapy and Counseling Resources Most providers on this list will take Medicaid. Patients with commercial insurance or Medicare should contact their insurance company to get a list of in network providers.  [facility] (spanish speaking therapist available)(habla espanol)(take medicare and medicaid)  [place], [place], [place] [address], [place] [email or web address] [phone]  [facility]iatry and Counseling [address] [place], [place] [address] [place], [place], [place] [address]      [phone]  [place] (spanish available) [place], [place] [address] [place] (take [facility] and medicare) [address], [place], [place] [address]       [phone]     [place] (virtual only) [phone]  [facility] [address], [place], [place]  [facility]:  [place]. [place] [phone]  [facility]:  [place] [address] [phone]  [facility] (under & uninsured) [address], [place] [phone]    [email or web address]    [place] [address]  [place]    [phone]  [facility] [place]     Phone:  [phone]     [place]  [place] [address]      [place], [place] [address] [place]: [place], [place], [place]   [place] ([place] therapist) [email or web address]  [place] [address]   [place] [address]    [phone]    [facility]   [address],  [place], [place]   [place]  [place]  [facility]  [address] [place], [place]        [phone]  Open Access/Walk In Clinic under & uninsured  [facility]  [address] [place], [place] Crisis [phone]  [facility] [place],  ([place])   [place], [place]: [phone]) 8:30 - 12; 1 - 2:30  [facility] of the [place],  [place], [place]    [phone]):8:30 - 12; 2 - 3PM  [facility] [employer],  [place],  [place]  [place]; [phone]):   Mon - Fri 8 AM - 5 PM  [facility] [address] [place]  MWF 12:30 to 3:00 or call to schedule an appointment  [phone]  Specific Provider options Psychology Today  [email or web address] click on find a therapist  enter your zip code left side and select or tailor a therapist for your specific need.   [facility] Provider Directory [email or web address]   (Medicaid)   Follow all drop down to find a provider  Social Support program Mental Health [place] [phone] or PhotoSolver.pl [address], [place], [place] Recovery support and educational   24- Hour Availability:   [facility]  [address] [place], [place] [phone] Crisis [phone]  [facility] the [facility] [phone]  [place] [facility]  [phone]   [facility]  [phone] (after hours)  Therapeutic Alternative/Mobile Crisis   [phone]  [place] National Suicide Hotline  [phone] Len Child[place])  Call 911 or go to emergency room  [facility]  [phone]);  [place] and [employer]  [phone]); [place], [place], [place], [place], [place], [place], [place]

## 2022-07-01 NOTE — Assessment & Plan Note (Addendum)
I am reassured by patient's normal neuroexam and CT last February which showed no intracranial process.  I do not think symptoms are due to orthostatic hypotension as she feels lightheaded even when sitting at times.  Lightheadedness could be due to the fact that patient does not eat breakfast, only snacks through the day and is very active at work. We discussed healthier eating habits: -I recommend eating breakfast, snacks, lunch, and dinner every day.  Always make sure you are getting a protein and a carbohydrate ((rice, potato, bread, pasta) here are some examples -Breakfast: Scrambled egg and toast, [place] yogurt with granola -Snack/lunch: Hard-boiled egg, string cheese, sandwich with lunch meat, carrot sticks, handful of nuts, whole fresh fruit -Dinner: Make sure you are getting a protein, carbohydrate and vegetable -STAY HYDRATED with water   We also discussed that her iron has been low in the past so we will check a CBC today to make sure anemia is not causing her symptoms.  She is currently on her period so pregnancy is not the cause of her lightheadedness.

## 2022-07-02 ENCOUNTER — Telehealth: Payer: Medicaid Other | Admitting: Physician Assistant

## 2022-07-02 ENCOUNTER — Telehealth: Payer: Self-pay

## 2022-07-02 DIAGNOSIS — R42 Dizziness and giddiness: Secondary | ICD-10-CM | POA: Diagnosis not present

## 2022-07-02 LAB — CBC
Hematocrit: 41.1 % (ref 34.0–46.6)
Hemoglobin: 14.1 g/dL (ref 11.1–15.9)
MCH: 30.3 pg (ref 26.6–33.0)
MCHC: 34.3 g/dL (ref 31.5–35.7)
MCV: 88 fL (ref 79–97)
Platelets: 278 10*3/uL (ref 150–450)
RBC: 4.66 x10E6/uL (ref 3.77–5.28)
RDW: 10.9 % — ABNORMAL LOW (ref 11.7–15.4)
WBC: 4.6 10*3/uL (ref 3.4–10.8)

## 2022-07-02 NOTE — Telephone Encounter (Signed)
Patient calls nurse line requesting lab results from yesterdays visit.    Patient advised she is not anemic as she was concerned with hemoglobin result.  Will forward to provider who saw patient for any additional input.

## 2022-07-02 NOTE — Progress Notes (Signed)
Virtual Visit Consent   Jamie Moyer, you are scheduled for a virtual visit with a [facility] provider today. Just as with appointments in the office, your consent must be obtained to participate. Your consent will be active for this visit and any virtual visit you may have with one of our providers in the next 365 days. If you have a MyChart account, a copy of this consent can be sent to you electronically.  As this is a virtual visit, video technology does not allow for your provider to perform a traditional examination. This may limit your provider's ability to fully assess your condition. If your provider identifies any concerns that need to be evaluated in person or the need to arrange testing (such as labs, EKG, etc.), we will make arrangements to do so. Although advances in technology are sophisticated, we cannot ensure that it will always work on either your end or our end. If the connection with a video visit is poor, the visit may have to be switched to a telephone visit. With either a video or telephone visit, we are not always able to ensure that we have a secure connection.  By engaging in this virtual visit, you consent to the provision of healthcare and authorize for your insurance to be billed (if applicable) for the services provided during this visit. Depending on your insurance coverage, you may receive a charge related to this service.  I need to obtain your verbal consent now. Are you willing to proceed with your visit today? Jamie Moyer has provided verbal consent on 07/02/2022 for a virtual visit (video or telephone). Tylene Fantasia Ward, PA-C  Date: 07/02/2022 5:04 PM  Virtual Visit via Video Note   I, Tylene Fantasia Ward, connected with  Jamie Moyer  ([id number], [birth year]) on 07/02/22 at  5:00 PM EST by a video-enabled telemedicine application and verified that I am speaking with the correct person using two identifiers.  Location: Patient: Virtual Visit Location  Patient: Home Provider: Virtual Visit Location Provider: Home Office   I discussed the limitations of evaluation and management by telemedicine and the availability of in person appointments. The patient expressed understanding and agreed to proceed.    History of Present Illness: Jamie Moyer is a 21 y.o. who identifies as a female who was assigned female at birth, and is being seen today to discuss lab results. Reports she has been feeling lightheaded and was seen by PCP yesterday for evaluation.  CBC drawn to evaluate for anemia.  Pt reports she suffers from anxiety and is feeling concerned about the results and was hoping to have someone explain them to her.  No change in sx since evaluation yesterday.  HPI: HPI  Problems:  Patient Active Problem List   Diagnosis Date Noted   Lightheadedness 07/01/2022   High blood pressure 12/21/2021   Gestational hypertension, third trimester 08/30/2019   Generalized anxiety disorder 04/25/2019   Tension headache 11/17/2015   Migraine without aura and without status migrainosus, not intractable 11/17/2015    Allergies:  Allergies  Allergen Reactions   Cephalexin Rash   Medications:  Current Outpatient Medications:    amitriptyline (ELAVIL) 25 MG tablet, TAKE 1 TABLET BY MOUTH EVERY NIGHT, Disp: 30 tablet, Rfl: 6   Blood Pressure Monitoring DEVI, Monitor BP every few days. (Patient not taking: Reported on 04/23/2022), Disp: 1 Units, Rfl: 0   busPIRone (BUSPAR) 5 MG tablet, Take 2.5 mg by mouth daily. (Patient not taking: Reported on 06/22/2022),  Disp: , Rfl:    Ferrous Fumarate (HEMOCYTE - 106 MG FE) 324 (106 Fe) MG TABS tablet, Take 1 tablet (106 mg of iron total) by mouth every other day. (Patient not taking: Reported on 06/22/2022), Disp: 30 tablet, Rfl: 3   fluticasone (FLONASE) 50 MCG/ACT nasal spray, Place 2 sprays into both nostrils daily., Disp: 16 mL, Rfl: 0   loratadine (CLARITIN) 10 MG tablet, Take 10 mg by mouth daily as needed for  allergies., Disp: , Rfl: 1   vitamin C (VITAMIN C) 500 MG tablet, Take 1 tablet (500 mg total) by mouth every other day. (Patient not taking: Reported on 06/22/2022), Disp: 30 tablet, Rfl: 3  Observations/Objective: Patient is well-developed, well-nourished in no acute distress.  Resting comfortably  at home.  Head is normocephalic, atraumatic.  No labored breathing.  Speech is clear and coherent with logical content.  Patient is alert and oriented at baseline.    Assessment and Plan: 1. Lightheadedness  Discussed CBC results with patient. WNL. Reassurance given.  Advised return to PCP if sx persists.   Follow Up Instructions: I discussed the assessment and treatment plan with the patient. The patient was provided an opportunity to ask questions and all were answered. The patient agreed with the plan and demonstrated an understanding of the instructions.  A copy of instructions were sent to the patient via MyChart unless otherwise noted below.     The patient was advised to call back or seek an in-person evaluation if the symptoms worsen or if the condition fails to improve as anticipated.  Time:  I spent 12 minutes with the patient via telehealth technology discussing the above problems/concerns.    Tylene Fantasia Ward, PA-C

## 2022-07-02 NOTE — Patient Instructions (Signed)
  [name], thank you for joining Tylene Fantasia Ward, PA-C for today's virtual visit.  While this provider is not your primary care provider (PCP), if your PCP is located in our provider database this encounter information will be shared with them immediately following your visit.   A [facility] MyChart account gives you access to today's visit and all your visits, tests, and labs performed at [facility] " click here if you don't have a [facility] MyChart account or go to [email or web address]  Consent: (Patient) [name] provided verbal consent for this virtual visit at the beginning of the encounter.  Current Medications:  Current Outpatient Medications:    amitriptyline (ELAVIL) 25 MG tablet, TAKE 1 TABLET BY MOUTH EVERY NIGHT, Disp: 30 tablet, Rfl: 6   Blood Pressure Monitoring DEVI, Monitor BP every few days. (Patient not taking: Reported on 04/23/2022), Disp: 1 Units, Rfl: 0   busPIRone (BUSPAR) 5 MG tablet, Take 2.5 mg by mouth daily. (Patient not taking: Reported on 06/22/2022), Disp: , Rfl:    Ferrous Fumarate (HEMOCYTE - 106 MG FE) 324 (106 Fe) MG TABS tablet, Take 1 tablet (106 mg of iron total) by mouth every other day. (Patient not taking: Reported on 06/22/2022), Disp: 30 tablet, Rfl: 3   fluticasone (FLONASE) 50 MCG/ACT nasal spray, Place 2 sprays into both nostrils daily., Disp: 16 mL, Rfl: 0   loratadine (CLARITIN) 10 MG tablet, Take 10 mg by mouth daily as needed for allergies., Disp: , Rfl: 1   vitamin C (VITAMIN C) 500 MG tablet, Take 1 tablet (500 mg total) by mouth every other day. (Patient not taking: Reported on 06/22/2022), Disp: 30 tablet, Rfl: 3   Medications ordered in this encounter:  No orders of the defined types were placed in this encounter.    *If you need refills on other medications prior to your next appointment, please contact your pharmacy*  Follow-Up: Call back or seek an in-person evaluation if the symptoms worsen or if the  condition fails to improve as anticipated.  [facility] [phone]  Other Instructions Labs results are not concerning.  RDW slightly low.  Follow recommendations from primary care physician.  Can make a follow up appointment with primary care physician if symptoms persists.     If you have been instructed to have an in-person evaluation today at a local Urgent Care facility, please use the link below. It will take you to a list of all of our available [facility], including address, phone number and hours of operation. Please do not delay care.  [facility]  If you or a family member do not have a primary care provider, use the link below to schedule a visit and establish care. When you choose a [facility] primary care physician or advanced practice provider, you gain a long-term partner in health. Find a Primary Care Provider  Learn more about [facility]'s in-office and virtual care options: [facility] - Get Care Now

## 2022-07-02 NOTE — Telephone Encounter (Signed)
Patient returns call to nurse line. She has concerns regarding abnormal RDW level.   She is requesting returned call to discuss this further.   Veronda Prude, RN

## 2022-07-09 NOTE — Telephone Encounter (Signed)
Error. See other encounter.

## 2022-07-13 ENCOUNTER — Encounter (HOSPITAL_COMMUNITY): Payer: Self-pay

## 2022-07-13 ENCOUNTER — Ambulatory Visit (HOSPITAL_COMMUNITY)
Admission: RE | Admit: 2022-07-13 | Discharge: 2022-07-13 | Disposition: A | Discharge status: Home / Self Care | Payer: Medicaid Other | Source: Ambulatory Visit | Attending: Family Medicine | Admitting: Family Medicine

## 2022-07-13 VITALS — BP 142/94 | HR 90 | Temp 97.6°F | Resp 18

## 2022-07-13 DIAGNOSIS — N390 Urinary tract infection, site not specified: Secondary | ICD-10-CM

## 2022-07-13 LAB — POCT URINALYSIS DIPSTICK, ED / UC
Bilirubin Urine: NEGATIVE
Glucose, UA: NEGATIVE mg/dL
Ketones, ur: NEGATIVE mg/dL
Nitrite: NEGATIVE
Protein, ur: NEGATIVE mg/dL
Specific Gravity, Urine: 1.01 (ref 1.005–1.030)
Urobilinogen, UA: 0.2 mg/dL (ref 0.0–1.0)
pH: 5.5 (ref 5.0–8.0)

## 2022-07-13 MED ORDER — SULFAMETHOXAZOLE-TRIMETHOPRIM 800-160 MG PO TABS: 1.0000 | ORAL_TABLET | Freq: Two times a day (BID) | ORAL | 0 refills | Status: AC

## 2022-07-13 NOTE — Discharge Instructions (Signed)
You were seen today for urinary tract infection symptoms.  I have sent out an antibiotic to take twice/day x 5 days.  Please increase fluids.  Return if you are not improving in the next 48 hours.

## 2022-07-13 NOTE — ED Triage Notes (Signed)
Pt states she has urinary pressure X 2 days. She has been using boric acid suppositories without relief. She is having some right side flank pain.   She doesn't have a new sexual partner but is having unprotected sex, she would like to make sure doesn't have a UTI and if she doesn't then she would like STI testing

## 2022-07-13 NOTE — ED Provider Notes (Signed)
[facility]    CSN: [id number] Arrival date & time: 07/13/22  1603      History   Chief Complaint Chief Complaint  Patient presents with   Urinary Frequency    I'm urinating frequently but only a little is coming out and it feels uncomfortable - Entered by patient    HPI Jamie Moyer is a 21 y.o. female.   Patient started yesterday with urinary symptoms.  Having the urge to urinate, but unable to go a lot.  Some pain at the right flank area, lower abdominal pressure.  No frequent UTI's.  No fevers/chills.  LMP was 2 weeks ago.    She is having unprotected intercourse, but does not really want STD testing at this time.        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 F/U Scheduled  Flu Vaccine   [ ]  Received    ] Declined Genetic Screen  NIPS:Low risk girl   AFP:   First Screen:  Quad:    TDaP vaccine    [ ]  Received    Arly.Keller ] Declined Hgb A1C or  GTT Early  Third trimester   Rhogam  N/A   LAB RESULTS   Feeding Pla    Patient Active Problem List   Diagnosis Date Noted   Lightheadedness 07/01/2022   High blood pressure 12/21/2021   Gestational hypertension, third trimester 08/30/2019   Generalized anxiety disorder 04/25/2019   Tension headache 11/17/2015   Migraine without aura and without status migrainosus, not intractable 11/17/2015    Past Surgical History:  Procedure Laterality Date   CESAREAN SECTION N/A 09/05/2019   Procedure: CESAREAN SECTION;  Surgeon: 01/17/2016, MD;  Location: [facility];  Service: Obstetrics;  Laterality: N/A;    LAPAROSCOPIC APPENDECTOMY N/A 12/24/2019   Procedure: APPENDECTOMY LAPAROSCOPIC;  Surgeon: Allie Bossier, MD;  Location: [facility] OR;  Service: General;  Laterality: N/A;   NO PAST SURGERIES      OB History     Gravida  1   Para  1   Term      Preterm  1   AB      Living  1      SAB      IAB      Ectopic      Multiple  0   Live Births  1            Home Medications    Prior to Admission medications   Medication Sig Start Date End Date Taking? Authorizing Provider  amitriptyline (ELAVIL) 25 MG tablet TAKE 1 TABLET BY MOUTH EVERY NIGHT 04/23/22  Yes [facility], MD  fluticasone [facility]) 50 MCG/ACT nasal spray Place 2 sprays into both nostrils daily. 11/15/20  Yes Covington, Sarah M, PA-C  loratadine (CLARITIN) 10 MG tablet Take 10 mg by mouth daily as needed for allergies. 11/07/15  Yes [provider]    Family History Family History  Problem Relation Age of Onset   Migraines Mother    Panic disorder Mother  High blood pressure Mother    Healthy Father    Kidney disease Maternal Grandmother    Diabetes Other    Heart disease Other     Social History Social History   Tobacco Use   Smoking status: Never    Passive exposure: Never   Smokeless tobacco: Never  Vaping Use   Vaping Use: Some days  Substance Use Topics   Alcohol use: Not Currently    Comment: <1 drink per week   Drug use: Yes    Frequency: 1.0 times per week    Types: Marijuana     Allergies   Cephalexin   Review of Systems Review of Systems  Constitutional: Negative.   HENT: Negative.    Respiratory: Negative.    Cardiovascular: Negative.   Gastrointestinal: Negative.   Genitourinary:  Positive for decreased urine volume, dysuria and frequency. Negative for hematuria and vaginal discharge.  Hematological: Negative.   Psychiatric/Behavioral: Negative.       Physical Exam Triage Vital Signs ED Triage Vitals  Enc Vitals Group     BP 07/13/22 1630 (!) 142/94      Pulse Rate 07/13/22 1630 90     Resp 07/13/22 1630 18     Temp 07/13/22 1630 97.6 F (36.4 C)     Temp Source 07/13/22 1630 Oral     SpO2 07/13/22 1630 98 %     Weight --      Height --      Head Circumference --      Peak Flow --      Pain Score 07/13/22 1628 0     Pain Loc --      Pain Edu? --      Excl. in GC? --    No data found.  Updated Vital Signs BP (!) 142/94 (BP Location: Left Arm)   Pulse 90   Temp 97.6 F (36.4 C) (Oral)   Resp 18   LMP 06/29/2022   SpO2 98%   Visual Acuity Right Eye Distance:   Left Eye Distance:   Bilateral Distance:    Right Eye Near:   Left Eye Near:    Bilateral Near:     Physical Exam Constitutional:      Appearance: Normal appearance.  Cardiovascular:     Rate and Rhythm: Normal rate and regular rhythm.  Pulmonary:     Effort: Pulmonary effort is normal.     Breath sounds: Normal breath sounds.  Abdominal:     Palpations: Abdomen is soft.     Tenderness: There is abdominal tenderness. There is no right CVA tenderness or left CVA tenderness.     Comments: Slight TTP to the suprapubic area  Skin:    General: Skin is warm.  Neurological:     General: No focal deficit present.     Mental Status: She is alert.  Psychiatric:        Mood and Affect: Mood normal.      UC Treatments / Results  Labs (all labs ordered are listed, but only abnormal results are displayed) Labs Reviewed  POCT URINALYSIS DIPSTICK, ED / UC - Abnormal; Notable for the following components:      Result Value   Hgb urine dipstick SMALL (*)    Leukocytes,Ua SMALL (*)    All other components within normal limits  CERVICOVAGINAL ANCILLARY ONLY    EKG   Radiology No results found.  Procedures Procedures (including critical care time)  Medications Ordered in UC Medications - No  data to display  Initial Impression / Assessment and Plan / UC Course  I have reviewed the triage vital signs and the nursing notes.  Pertinent labs &  imaging results that were available during my care of the patient were reviewed by me and considered in my medical decision making (see chart for details).  Patient was seen today for UTI symptoms.  Urine appears positive for UTI.  She declines STD testing at this time.  Will return if needed.    Final Clinical Impressions(s) / UC Diagnoses   Final diagnoses:  Lower urinary tract infectious disease     Discharge Instructions      You were seen today for urinary tract infection symptoms.  I have sent out an antibiotic to take twice/day x 5 days.  Please increase fluids.  Return if you are not improving in the next 48 hours.     ED Prescriptions     Medication Sig Dispense Auth. Provider   sulfamethoxazole-trimethoprim (BACTRIM DS) 800-160 MG tablet Take 1 tablet by mouth 2 (two) times daily for 5 days. 10 tablet Jannifer Franklin, MD      PDMP not reviewed this encounter.   Jannifer Franklin, MD 07/13/22 1659

## 2022-08-31 NOTE — Progress Notes (Signed)
    SUBJECTIVE:   Chief compliant/HPI: annual examination  Jamie Moyer is a 22 y.o. who presents today for an annual exam.   Lightheadedness: experiences it a couple times a week for up to an hour with associating dizziness as if her eyes can't focus well. She is wondering if her eye prescription is out dated. Has only had SOB with 1 of these episodes. Never syncopal episode. Denies palpitations  Updated history tabs and problem list.   OBJECTIVE:  BP 118/76   Pulse 84   Ht 4' 10.5" (1.486 m)   Wt 81 lb 6.4 oz (36.9 kg)   LMP 08/26/2022   SpO2 98%   BMI 16.72 kg/m   General: Well-appearing, NAD CV: Tachycardic briefly, then RRR without murmurs Pulm: CTAB, normal WOB Pelvic exam: VULVA: normal appearing vulva with no masses, tenderness or lesions, VAGINA: normal appearing vagina with normal color and discharge, no lesions, CERVIX: normal appearing cervix without discharge or lesions.   ASSESSMENT/PLAN:  Encounter for annual physical exam Assessment & Plan: Blood pressure reviewed and at goal.  Asked about intimate partner violence and patient reports none.  The patient currently uses nothing for contraception. OCPs initiated during encounter, she has tried them in the past and seeks to restart. Advanced directives not discussed.   Considered the following items based upon USPSTF recommendations: patient deferred STD testing at this time. HIV testing: discussed Hepatitis C: discussed Hepatitis B: discussed Syphilis if at high risk: discussed GC/CT not at high risk and not ordered. Lipid panel (nonfasting or fasting) discussed based upon AHA recommendations and not ordered.  Consider repeat every 4-6 years.  Reviewed risk factors for latent tuberculosis and not indicated  Discussed family history, BRCA testing not indicated. No family history of breast cancer. Cervical cancer screening: due for Pap today, cytology alone ordered (HPV if ASCUS) Immunizations: None  today   Lightheadedness Assessment & Plan: Dr. Ronnald Ramp did a thorough workup previously.  No reason to believe that anemia is playing a role here.  I will check thyroid.  I also suspect there is a component of anxiety.  She believes it may be her eye prescription is off which is what is causing the dizziness however the infrequent timeline of this and that it sometimes correlates with specific moments in her life, leads me to believe that anxiety is playing a role.  I did recommend she consider treatment for this as she was also quite tachycardic until the end of the visit in which her nerves were calm about discussing medical management and getting her Pap smear done.  Orders: -     TSH  Encounter for initial prescription of contraceptive pills Assessment & Plan: Initiate OCPs starting with first day of next period.  No history of blood clots or migraine with aura.  Orders: -     Norgestimate-Eth Estradiol; Take 1 tablet by mouth daily.  Dispense: 28 tablet; Refill: 11  Encounter for routine gynecological examination with Papanicolaou smear of cervix -     Cytology - PAP  Return in about 1 year (around 09/03/2023) for Annual physical.   Wells Guiles, [place]

## 2022-09-02 ENCOUNTER — Encounter: Payer: Self-pay | Admitting: Student

## 2022-09-02 ENCOUNTER — Ambulatory Visit (INDEPENDENT_AMBULATORY_CARE_PROVIDER_SITE_OTHER): Payer: Medicaid Other | Admitting: Student

## 2022-09-02 ENCOUNTER — Other Ambulatory Visit (HOSPITAL_COMMUNITY)
Admission: RE | Admit: 2022-09-02 | Discharge: 2022-09-02 | Disposition: A | Discharge status: Home / Self Care | Payer: Medicaid Other | Source: Ambulatory Visit | Attending: Family Medicine | Admitting: Family Medicine

## 2022-09-02 VITALS — BP 118/76 | HR 84 | Ht 58.5 in | Wt 81.4 lb

## 2022-09-02 DIAGNOSIS — Z309 Encounter for contraceptive management, unspecified: Secondary | ICD-10-CM | POA: Insufficient documentation

## 2022-09-02 DIAGNOSIS — R42 Dizziness and giddiness: Secondary | ICD-10-CM | POA: Diagnosis not present

## 2022-09-02 DIAGNOSIS — Z01419 Encounter for gynecological examination (general) (routine) without abnormal findings: Secondary | ICD-10-CM

## 2022-09-02 DIAGNOSIS — Z30011 Encounter for initial prescription of contraceptive pills: Secondary | ICD-10-CM

## 2022-09-02 DIAGNOSIS — Z0000 Encounter for general adult medical examination without abnormal findings: Secondary | ICD-10-CM

## 2022-09-02 MED ORDER — NORGESTIMATE-ETH ESTRADIOL 0.25-35 MG-MCG PO TABS: 1.0000 | ORAL_TABLET | Freq: Every day | ORAL | 11 refills | Status: DC

## 2022-09-02 NOTE — Assessment & Plan Note (Signed)
Initiate OCPs starting with first day of next period.  No history of blood clots or migraine with aura.

## 2022-09-02 NOTE — Assessment & Plan Note (Signed)
Blood pressure reviewed and at goal.  Asked about intimate partner violence and patient reports none.  The patient currently uses nothing for contraception. OCPs initiated during encounter, she has tried them in the past and seeks to restart. Advanced directives not discussed.   Considered the following items based upon USPSTF recommendations: patient deferred STD testing at this time. HIV testing: discussed Hepatitis C: discussed Hepatitis B: discussed Syphilis if at high risk: discussed GC/CT not at high risk and not ordered. Lipid panel (nonfasting or fasting) discussed based upon AHA recommendations and not ordered.  Consider repeat every 4-6 years.  Reviewed risk factors for latent tuberculosis and not indicated  Discussed family history, BRCA testing not indicated. No family history of breast cancer. Cervical cancer screening: due for Pap today, cytology alone ordered (HPV if ASCUS) Immunizations: None today

## 2022-09-02 NOTE — Assessment & Plan Note (Signed)
Dr. Ronnald Ramp did a thorough workup previously.  No reason to believe that anemia is playing a role here.  I will check thyroid.  I also suspect there is a component of anxiety.  She believes it may be her eye prescription is off which is what is causing the dizziness however the infrequent timeline of this and that it sometimes correlates with specific moments in her life, leads me to believe that anxiety is playing a role.  I did recommend she consider treatment for this as she was also quite tachycardic until the end of the visit in which her nerves were calm about discussing medical management and getting her Pap smear done.

## 2022-09-02 NOTE — Patient Instructions (Addendum)
It was great to see you today! Thank you for choosing [facility] for your primary care. Jamie Moyer was seen for physical exam.  Today we addressed: Lightheadedness: Will check your thyroid.  I suspect this is more related to your vision or potentially be anxiety. I have prescribed you oral contraceptives.  Start these on the first day of your period.  If you haven't already, sign up for My Chart to have easy access to your labs results, and communication with your primary care physician.  We are checking some labs today. If they are abnormal, I will call you. If they are normal, I will send you a MyChart message (if it is active) or a letter in the mail. If you do not hear about your labs in the next 2 weeks, please call the office.  You should return to our clinic Return in about 1 year (around 09/03/2023) for Annual physical. Please arrive 15 minutes before your appointment to ensure smooth check in process.  We appreciate your efforts in making this happen.  Thank you for allowing me to participate in your care, Wells Guiles, DO 09/02/2022, 4:11 PM PGY-2, [place]

## 2022-09-03 LAB — TSH: TSH: 1.38 u[IU]/mL (ref 0.450–4.500)

## 2022-09-06 LAB — CYTOLOGY - PAP: Diagnosis: NEGATIVE

## 2022-10-29 ENCOUNTER — Telehealth: Payer: BC Managed Care – PPO | Admitting: Physician Assistant

## 2022-10-29 DIAGNOSIS — H9201 Otalgia, right ear: Secondary | ICD-10-CM

## 2022-10-29 MED ORDER — FLUTICASONE PROPIONATE 50 MCG/ACT NA SUSP: 2.0000 | Freq: Every day | NASAL | 6 refills | Status: DC

## 2022-10-29 MED ORDER — LORATADINE 10 MG PO TABS: 10.0000 mg | ORAL_TABLET | Freq: Every day | ORAL | 11 refills | Status: DC

## 2022-10-29 NOTE — Patient Instructions (Signed)
  Jamie Moyer, thank you for joining Tylene Fantasia Ward, PA-C for today's virtual visit.  While this provider is not your primary care provider (PCP), if your PCP is located in our provider database this encounter information will be shared with them immediately following your visit.   A [facility] MyChart account gives you access to today's visit and all your visits, tests, and labs performed at [facility] " click here if you don't have a [facility] MyChart account or go to [email or web address]  Consent: (Patient) Jamie Moyer provided verbal consent for this virtual visit at the beginning of the encounter.  Current Medications:  Current Outpatient Medications:    fluticasone (FLONASE) 50 MCG/ACT nasal spray, Place 2 sprays into both nostrils daily., Disp: 16 g, Rfl: 6   loratadine (CLARITIN) 10 MG tablet, Take 1 tablet (10 mg total) by mouth daily., Disp: 30 tablet, Rfl: 11   amitriptyline (ELAVIL) 25 MG tablet, TAKE 1 TABLET BY MOUTH EVERY NIGHT, Disp: 30 tablet, Rfl: 6   norgestimate-ethinyl estradiol (SPRINTEC 28) 0.25-35 MG-MCG tablet, Take 1 tablet by mouth daily., Disp: 28 tablet, Rfl: 11   Medications ordered in this encounter:  Meds ordered this encounter  Medications   fluticasone (FLONASE) 50 MCG/ACT nasal spray    Sig: Place 2 sprays into both nostrils daily.    Dispense:  16 g    Refill:  6    Order Specific Question:   Supervising Provider    Answer:   Merrilee Jansky [[id number]]   loratadine (CLARITIN) 10 MG tablet    Sig: Take 1 tablet (10 mg total) by mouth daily.    Dispense:  30 tablet    Refill:  11    Order Specific Question:   Supervising Provider    Answer:   Merrilee Jansky [id number]     *If you need refills on other medications prior to your next appointment, please contact your pharmacy*  Follow-Up: Call back or seek an in-person evaluation if the symptoms worsen or if the condition fails to improve as anticipated.  [facility] [phone]  Other Instructions Take allergy medicine once daily.  Recommend Flonase.  If no improvement follow up for in person evaluation with PCP or Urgent Care.    If you have been instructed to have an in-person evaluation today at a local Urgent Care facility, please use the link below. It will take you to a list of all of our available [facility], including address, phone number and hours of operation. Please do not delay care.  [facility]  If you or a family member do not have a primary care provider, use the link below to schedule a visit and establish care. When you choose a [facility] primary care physician or advanced practice provider, you gain a long-term partner in health. Find a Primary Care Provider  Learn more about [facility]'s in-office and virtual care options: [facility] - Get Care Now

## 2022-10-29 NOTE — Progress Notes (Signed)
Virtual Visit Consent   Jamie Moyer, you are scheduled for a virtual visit with a [facility] provider today. Just as with appointments in the office, your consent must be obtained to participate. Your consent will be active for this visit and any virtual visit you may have with one of our providers in the next 365 days. If you have a MyChart account, a copy of this consent can be sent to you electronically.  As this is a virtual visit, video technology does not allow for your provider to perform a traditional examination. This may limit your provider's ability to fully assess your condition. If your provider identifies any concerns that need to be evaluated in person or the need to arrange testing (such as labs, EKG, etc.), we will make arrangements to do so. Although advances in technology are sophisticated, we cannot ensure that it will always work on either your end or our end. If the connection with a video visit is poor, the visit may have to be switched to a telephone visit. With either a video or telephone visit, we are not always able to ensure that we have a secure connection.  By engaging in this virtual visit, you consent to the provision of healthcare and authorize for your insurance to be billed (if applicable) for the services provided during this visit. Depending on your insurance coverage, you may receive a charge related to this service.  I need to obtain your verbal consent now. Are you willing to proceed with your visit today? Jamie Moyer has provided verbal consent on 10/29/2022 for a virtual visit (video or telephone). Tylene Fantasia Ward, PA-C  Date: 10/29/2022 5:49 PM  Virtual Visit via Video Note   I, Tylene Fantasia Ward, connected with  Jamie Moyer  ([id number], 08-14-2000) on 10/29/22 at  5:45 PM EDT by a video-enabled telemedicine application and verified that I am speaking with the correct person using two identifiers.  Location: Patient: Virtual Visit Location  Patient: Home Provider: Virtual Visit Location Provider: Home   I discussed the limitations of evaluation and management by telemedicine and the availability of in person appointments. The patient expressed understanding and agreed to proceed.    History of Present Illness: Jamie Moyer is a 22 y.o. who identifies as a female who was assigned female at birth, and is being seen today for intermittent right ear pain itching that started today.  She reports experiencing similar sx about one month ago.  She denies drainage, no trouble hearing, congestion, fever,.  She has tried earache drops with no relief. Reports h/o allergies, but is currently not taking allergy medications.   HPI: HPI  Problems:  Patient Active Problem List   Diagnosis Date Noted   Contraceptive management 09/02/2022   Encounter for annual physical exam 09/02/2022   Lightheadedness 07/01/2022   Generalized anxiety disorder 04/25/2019   Migraine without aura and without status migrainosus, not intractable 11/17/2015    Allergies:  Allergies  Allergen Reactions   Cephalexin Rash   Medications:  Current Outpatient Medications:    fluticasone (FLONASE) 50 MCG/ACT nasal spray, Place 2 sprays into both nostrils daily., Disp: 16 g, Rfl: 6   loratadine (CLARITIN) 10 MG tablet, Take 1 tablet (10 mg total) by mouth daily., Disp: 30 tablet, Rfl: 11   amitriptyline (ELAVIL) 25 MG tablet, TAKE 1 TABLET BY MOUTH EVERY NIGHT, Disp: 30 tablet, Rfl: 6   norgestimate-ethinyl estradiol (SPRINTEC 28) 0.25-35 MG-MCG tablet, Take 1 tablet by mouth daily.,  Disp: 28 tablet, Rfl: 11  Observations/Objective: Patient is well-developed, well-nourished in no acute distress.  Resting comfortably at home.  Head is normocephalic, atraumatic.  No labored breathing.  Speech is clear and coherent with logical content.  Patient is alert and oriented at baseline.    Assessment and Plan: 1. Right ear pain - fluticasone (FLONASE) 50 MCG/ACT  nasal spray; Place 2 sprays into both nostrils daily.  Dispense: 16 g; Refill: 6 - loratadine (CLARITIN) 10 MG tablet; Take 1 tablet (10 mg total) by mouth daily.  Dispense: 30 tablet; Refill: 11  Given intermittent sx and itching suspect allergies, advise supportive care.  If no improvement recommend in person evaluation.   Follow Up Instructions: I discussed the assessment and treatment plan with the patient. The patient was provided an opportunity to ask questions and all were answered. The patient agreed with the plan and demonstrated an understanding of the instructions.  A copy of instructions were sent to the patient via MyChart unless otherwise noted below.     The patient was advised to call back or seek an in-person evaluation if the symptoms worsen or if the condition fails to improve as anticipated.  Time:  I spent 9 minutes with the patient via telehealth technology discussing the above problems/concerns.    Tylene Fantasia Ward, PA-C

## 2022-10-31 ENCOUNTER — Telehealth: Payer: BC Managed Care – PPO | Admitting: Family

## 2022-10-31 DIAGNOSIS — R42 Dizziness and giddiness: Secondary | ICD-10-CM

## 2022-10-31 DIAGNOSIS — G43009 Migraine without aura, not intractable, without status migrainosus: Secondary | ICD-10-CM

## 2022-10-31 NOTE — Progress Notes (Signed)
Virtual Visit Consent   Jamie Moyer, you are scheduled for a virtual visit with a [facility] provider today. Just as with appointments in the office, your consent must be obtained to participate. Your consent will be active for this visit and any virtual visit you may have with one of our providers in the next 365 days. If you have a MyChart account, a copy of this consent can be sent to you electronically.  As this is a virtual visit, video technology does not allow for your provider to perform a traditional examination. This may limit your provider's ability to fully assess your condition. If your provider identifies any concerns that need to be evaluated in person or the need to arrange testing (such as labs, EKG, etc.), we will make arrangements to do so. Although advances in technology are sophisticated, we cannot ensure that it will always work on either your end or our end. If the connection with a video visit is poor, the visit may have to be switched to a telephone visit. With either a video or telephone visit, we are not always able to ensure that we have a secure connection.  By engaging in this virtual visit, you consent to the provision of healthcare and authorize for your insurance to be billed (if applicable) for the services provided during this visit. Depending on your insurance coverage, you may receive a charge related to this service.  I need to obtain your verbal consent now. Are you willing to proceed with your visit today? Jamie Moyer has provided verbal consent on 10/31/2022 for a virtual visit (video or telephone). [name], FNP  Date: 10/31/2022 7:47 PM  Virtual Visit via Video Note   I, [name], connected with  Jamie Moyer  ([id number], 2001-02-17) on 10/31/22 at  7:30 PM EDT by a video-enabled telemedicine application and verified that I am speaking with the correct person using two identifiers.  Location: Patient: Virtual Visit Location Patient:  Home Provider: Virtual Visit Location Provider: Home Office   I discussed the limitations of evaluation and management by telemedicine and the availability of in person appointments. The patient expressed understanding and agreed to proceed.    History of Present Illness: Jamie Moyer is a 22 y.o. who identifies as a female who was assigned female at birth, and is being seen today for light headedness. She reports this started several months ago and saw her PCP and had lab work and neuro exam that was normal.   She is followed by Neurologists every 6 months for migraines every 6 months. She takes amitriptyline 25 mg. States she takes this 3 to 4 times a week.    HPI: HPI  Problems:  Patient Active Problem List   Diagnosis Date Noted   Contraceptive management 09/02/2022   Encounter for annual physical exam 09/02/2022   Lightheadedness 07/01/2022   Generalized anxiety disorder 04/25/2019   Migraine without aura and without status migrainosus, not intractable 11/17/2015    Allergies:  Allergies  Allergen Reactions   Cephalexin Rash   Medications:  Current Outpatient Medications:    amitriptyline (ELAVIL) 25 MG tablet, TAKE 1 TABLET BY MOUTH EVERY NIGHT, Disp: 30 tablet, Rfl: 6   fluticasone (FLONASE) 50 MCG/ACT nasal spray, Place 2 sprays into both nostrils daily., Disp: 16 g, Rfl: 6   loratadine (CLARITIN) 10 MG tablet, Take 1 tablet (10 mg total) by mouth daily., Disp: 30 tablet, Rfl: 11   norgestimate-ethinyl estradiol (SPRINTEC 28) 0.25-35 MG-MCG  tablet, Take 1 tablet by mouth daily., Disp: 28 tablet, Rfl: 11  Observations/Objective: Patient is well-developed, well-nourished in no acute distress.  Resting comfortably  at home.  Head is normocephalic, atraumatic.  No labored breathing.  Speech is clear and coherent with logical content.  Patient is alert and oriented at baseline.    Assessment and Plan: 1. Lightheadedness  Given patient taking amitriptyline 3 times a  week. This could be causing her symptoms. States she has not had a migraine in months. Recommend patient to hold medication since she is not taking regularly and see if this helps her symptoms. She will call her neurologists in AM and schedule follow up appointment.  Stress management  Follow up with PCP  Follow Up Instructions: I discussed the assessment and treatment plan with the patient. The patient was provided an opportunity to ask questions and all were answered. The patient agreed with the plan and demonstrated an understanding of the instructions.  A copy of instructions were sent to the patient via MyChart unless otherwise noted below.     The patient was advised to call back or seek an in-person evaluation if the symptoms worsen or if the condition fails to improve as anticipated.  Time:  I spent 18 minutes with the patient via telehealth technology discussing the above problems/concerns.    [name], FNP

## 2022-11-11 ENCOUNTER — Telehealth (INDEPENDENT_AMBULATORY_CARE_PROVIDER_SITE_OTHER): Payer: Self-pay

## 2022-11-11 ENCOUNTER — Ambulatory Visit (INDEPENDENT_AMBULATORY_CARE_PROVIDER_SITE_OTHER): Payer: Medicaid Other | Admitting: Neurology

## 2022-11-11 NOTE — Telephone Encounter (Signed)
LM for patient to call office re: today's late/possible missed appt to reschedule if needed.  B. Roten CMA

## 2022-11-11 NOTE — Progress Notes (Deleted)
Patient: Jamie Moyer MRN: [id number] Sex: female DOB: 06-05-2001  Provider: Keturah Shavers, MD Location of Care: [facility]  Note type: {CN NOTE [id number]}  Referral Source: Shelby Mattocks DO History from: {CN REFERRED AO:[id number]} Chief Complaint: Follow up Migraines  History of Present Illness:  Jamie Moyer is a 22 y.o. female ***.  Review of Systems: Review of system as per HPI, otherwise negative.  Past Medical History:  Diagnosis Date   Anxiety    Depression    Gestational hypertension, third trimester 08/30/2019    Aspirin 81 mg daily after 12 weeks  Current antihypertensives:  None      Baseline and surveillance labs (pulled in from [facility], refresh links as needed)       Lab Results  Component  Value  Date     PLT  237  07/25/2019     CREATININE  0.47 (L)  07/25/2019     AST  11  07/25/2019     ALT  8  07/25/2019        Antenatal Testing  CHTN - O10.919   Group I   BP < 140/90, no meds, no preeclampsia, A   Marginal insertion of umbilical cord affecting management of mother in third trimester 09/04/2019   Mononucleosis    Precordial catch syndrome    Spleen enlargement    Supervision of normal first pregnancy, antepartum 03/20/2019    Nursing Staff Provider  Office Location  [facility] Dating  LMP 12/30/2018  Language  English Anatomy US  Normal, but limited d/t fetal position. F/U Scheduled  Flu Vaccine    Received    Arly.Keller ] Declined Genetic Screen  NIPS:Low risk girl   AFP:   First Screen:  Quad:    TDaP vaccine     Received    Arly.Keller ] Declined Hgb A1C or  GTT Early  Third trimester   Rhogam  N/A   LAB RESULTS   Feeding Pla   Hospitalizations: {yes no:314532}, Head Injury: {yes no:314532}, Nervous System Infections: {yes no:314532}, Immunizations up to date: {yes no:314532}  Birth History ***  Surgical History Past Surgical History:  Procedure Laterality Date   CESAREAN SECTION N/A 09/05/2019   Procedure: CESAREAN SECTION;  Surgeon:  Allie Bossier, MD;  Location: [facility] LD ORS;  Service: Obstetrics;  Laterality: N/A;   LAPAROSCOPIC APPENDECTOMY N/A 12/24/2019   Procedure: APPENDECTOMY LAPAROSCOPIC;  Surgeon: Axel Filler, MD;  Location: [facility];  Service: General;  Laterality: N/A;   NO PAST SURGERIES      Family History family history includes Diabetes in an other family member; Healthy in her father; Heart disease in an other family member; Heart failure in her mother; High blood pressure in her mother; Kidney disease in her maternal grandmother; Migraines in her mother; Panic disorder in her mother. Family History is negative for ***.  Social History Social History   Socioeconomic History   Marital status: Single    Spouse name: Not on file   Number of children: Not on file   Years of education: Not on file   Highest education level: High school graduate  Occupational History   Occupation: Online Business  Tobacco Use   Smoking status: Never    Passive exposure: Never   Smokeless tobacco: Never  Vaping Use   Vaping Use: Some days  Substance and Sexual Activity   Alcohol use: Not Currently    Comment: <1 drink per week   Drug use: Not  Currently    Frequency: 1.0 times per week    Types: Marijuana   Sexual activity: Yes    Partners: Male    Birth control/protection: None  Other Topics Concern   Not on file  Social History Narrative   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   Lives with her mother. She has adult siblings that do no live in the home.    Social Determinants of Health   Financial Resource Strain: Unknown (03/20/2019)   Overall Financial Resource Strain (CARDIA)    Difficulty of Paying Living Expenses: Patient declined  Food Insecurity: Unknown (03/20/2019)   Hunger Vital Sign    Worried About Running Out of Food in the Last Year: Patient declined    Ran Out of Food in the Last Year: Patient declined  Transportation Needs: Unknown (03/20/2019)   PRAPARE - Therapist, art (Medical): Patient declined    Lack of Transportation (Non-Medical): Patient declined  Physical Activity: Unknown (03/20/2019)   Exercise Vital Sign    Days of Exercise per Week: Patient declined    Minutes of Exercise per Session: Patient declined  Stress: Unknown (03/20/2019)   [employer] of Occupational Health - Occupational Stress Questionnaire    Feeling of Stress : Patient declined  Social Connections: Unknown (03/20/2019)   Social Connection and Isolation Panel [NHANES]    Frequency of Communication with Friends and Family: Patient declined    Frequency of Social Gatherings with Friends and Family: Patient declined    Attends Religious Services: Patient declined    Database administrator or Organizations: Patient declined    Attends Banker Meetings: Patient declined    Marital Status: Patient declined     Allergies  Allergen Reactions   Cephalexin Rash    Physical Exam There were no vitals taken for this visit. ***  Assessment and Plan ***  No orders of the defined types were placed in this encounter.  No orders of the defined types were placed in this encounter.

## 2022-11-12 ENCOUNTER — Ambulatory Visit (INDEPENDENT_AMBULATORY_CARE_PROVIDER_SITE_OTHER): Payer: Medicaid Other | Admitting: Neurology

## 2022-11-19 ENCOUNTER — Telehealth: Payer: BC Managed Care – PPO | Admitting: Physician Assistant

## 2022-11-19 ENCOUNTER — Telehealth: Payer: BC Managed Care – PPO

## 2022-11-19 DIAGNOSIS — R051 Acute cough: Secondary | ICD-10-CM

## 2022-11-19 MED ORDER — BENZONATATE 100 MG PO CAPS: 100.0000 mg | ORAL_CAPSULE | Freq: Three times a day (TID) | ORAL | 0 refills | Status: DC | PRN

## 2022-11-19 NOTE — Progress Notes (Signed)
Virtual Visit Consent   Jamie Moyer, you are scheduled for a virtual visit with a [facility] provider today. Just as with appointments in the office, your consent must be obtained to participate. Your consent will be active for this visit and any virtual visit you may have with one of our providers in the next 365 days. If you have a MyChart account, a copy of this consent can be sent to you electronically.  As this is a virtual visit, video technology does not allow for your provider to perform a traditional examination. This may limit your provider's ability to fully assess your condition. If your provider identifies any concerns that need to be evaluated in person or the need to arrange testing (such as labs, EKG, etc.), we will make arrangements to do so. Although advances in technology are sophisticated, we cannot ensure that it will always work on either your end or our end. If the connection with a video visit is poor, the visit may have to be switched to a telephone visit. With either a video or telephone visit, we are not always able to ensure that we have a secure connection.  By engaging in this virtual visit, you consent to the provision of healthcare and authorize for your insurance to be billed (if applicable) for the services provided during this visit. Depending on your insurance coverage, you may receive a charge related to this service.  I need to obtain your verbal consent now. Are you willing to proceed with your visit today? [name] has provided verbal consent on 11/19/2022 for a virtual visit (video or telephone). Tylene Fantasia Ward, PA-C  Date: 11/19/2022 6:17 PM  Virtual Visit via Video Note   I, Tylene Fantasia Ward, connected with  Jamie Moyer  ([id number], Dec 06, 2000) on 11/19/22 at  6:15 PM EDT by a video-enabled telemedicine application and verified that I am speaking with the correct person using two identifiers.  Location: Patient: Virtual Visit Location Patient:  Home Provider: Virtual Visit Location Provider: Home   I discussed the limitations of evaluation and management by telemedicine and the availability of in person appointments. The patient expressed understanding and agreed to proceed.    History of Present Illness: Jamie Moyer is a 22 y.o. who identifies as a female who was assigned female at birth, and is being seen today for cough that started 2-3 days ago.  Reports it is nonproductive.  Pt reports a h/o allergies.  She denies fever, chills, shortness of breath, wheezing.  She is currently taking Nyquil which helps with the cough.  She reports postnasal drip.   HPI: HPI  Problems:  Patient Active Problem List   Diagnosis Date Noted   Contraceptive management 09/02/2022   Encounter for annual physical exam 09/02/2022   Lightheadedness 07/01/2022   Generalized anxiety disorder 04/25/2019   Allergic rhinitis 01/26/2019   Sebaceous cyst of breast, right 09/02/2017   Breast mass, right 08/23/2017   Left leg pain 07/01/2016   Left leg numbness 07/01/2016   Migraine without aura and without status migrainosus, not intractable 11/17/2015   Anxiety 11/13/2015   IBS (irritable bowel syndrome) 11/13/2015   Chest pain 10/06/2015   GERD (gastroesophageal reflux disease) 10/06/2015   Panic attack 09/29/2015   Precordial catch syndrome 09/29/2015    Allergies:  Allergies  Allergen Reactions   Cephalexin Rash   Medications:  Current Outpatient Medications:    benzonatate (TESSALON) 100 MG capsule, Take 1 capsule (100 mg total) by  mouth 3 (three) times daily as needed., Disp: 20 capsule, Rfl: 0   amitriptyline (ELAVIL) 25 MG tablet, TAKE 1 TABLET BY MOUTH EVERY NIGHT, Disp: 30 tablet, Rfl: 6   busPIRone (BUSPAR) 7.5 MG tablet, Take 7.5 mg by mouth daily at 6 (six) AM., Disp: , Rfl:    Ergocalciferol 10 MCG (400 UNIT) TABS, Take 20 mcg by mouth daily at 6 (six) AM., Disp: , Rfl:    fluticasone (FLONASE) 50 MCG/ACT nasal spray, Place 2  sprays into both nostrils daily., Disp: 16 g, Rfl: 6   ibuprofen (ADVIL) 400 MG tablet, Take 400 mg by mouth every 6 (six) hours as needed for fever, headache, mild pain, moderate pain or cramping., Disp: , Rfl:    loratadine (CLARITIN) 10 MG tablet, Take 1 tablet (10 mg total) by mouth daily., Disp: 30 tablet, Rfl: 11   naproxen (NAPROSYN) 375 MG tablet, Take 375 mg by mouth as directed., Disp: , Rfl:    norgestimate-ethinyl estradiol (SPRINTEC 28) 0.25-35 MG-MCG tablet, Take 1 tablet by mouth daily., Disp: 28 tablet, Rfl: 11   topiramate (TOPAMAX) 50 MG tablet, Take 50 mg by mouth 2 (two) times daily., Disp: , Rfl:    valACYclovir (VALTREX) 1000 MG tablet, Take 1 tablet by mouth as directed., Disp: , Rfl:   Observations/Objective: Patient is well-developed, well-nourished in no acute distress.  Resting comfortably at home.  Head is normocephalic, atraumatic.  No labored breathing.  Speech is clear and coherent with logical content.  Patient is alert and oriented at baseline.    Assessment and Plan: 1. Acute cough  Cough is likely related to allergies, postnasal drip.  Advised allergy medication.  I did send in tessalon to take as needed.  In person evaluation precautions given.   Follow Up Instructions: I discussed the assessment and treatment plan with the patient. The patient was provided an opportunity to ask questions and all were answered. The patient agreed with the plan and demonstrated an understanding of the instructions.  A copy of instructions were sent to the patient via MyChart unless otherwise noted below.     The patient was advised to call back or seek an in-person evaluation if the symptoms worsen or if the condition fails to improve as anticipated.  Time:  I spent 13 minutes with the patient via telehealth technology discussing the above problems/concerns.    Tylene Fantasia Ward, PA-C

## 2022-11-19 NOTE — Progress Notes (Unsigned)
Patient: Jamie Moyer MRN: [id number] Sex: female DOB: 02-Mar-2001  Provider: Keturah Shavers, MD Location of Care: [facility]rology  Note type: {CN NOTE [id number]}  Referral Source: Christel Mormon, MD   History from: {CN REFERRED QI:[id number]} Chief Complaint: Follow up Migraines  History of Present Illness:  Jamie Moyer is a 22 y.o. female ***.  Review of Systems: Review of system as per HPI, otherwise negative.  Past Medical History:  Diagnosis Date   Anxiety    Depression    Gestational hypertension, third trimester 08/30/2019   [ ]  Aspirin 81 mg daily after 12 weeks  Current antihypertensives:  None      Baseline and surveillance labs (pulled in from [facility], refresh links as needed)       Lab Results  Component  Value  Date     PLT  237  07/25/2019     CREATININE  0.47 (L)  07/25/2019     AST  11  07/25/2019     ALT  8  07/25/2019        Antenatal Testing  CHTN - O10.919   Group I   BP < 140/90, no meds, no preeclampsia, A   Marginal insertion of umbilical cord affecting management of mother in third trimester 09/04/2019   Mononucleosis    Precordial catch syndrome    Spleen enlargement    Supervision of normal first pregnancy, antepartum 03/20/2019    Nursing Staff Provider  Office Location  [facility] Dating  LMP 12/30/2018  Language  English Anatomy US  Normal, but limited d/t fetal position. F/U Scheduled  Flu Vaccine   [ ]  Received    Arly.Keller ] Declined Genetic Screen  NIPS:Low risk girl   AFP:   First Screen:  Quad:    TDaP vaccine    [ ]  Received    Arly.Keller ] Declined Hgb A1C or  GTT Early  Third trimester   Rhogam  N/A   LAB RESULTS   Feeding Pla   Hospitalizations: {yes no:314532}, Head Injury: {yes no:314532}, Nervous System Infections: {yes no:314532}, Immunizations up to date: {yes no:314532}  Birth History ***  Surgical History Past Surgical History:  Procedure Laterality Date   CESAREAN SECTION N/A 09/05/2019   Procedure: CESAREAN SECTION;   Surgeon: Allie Bossier, MD;  Location: [facility] LD ORS;  Service: Obstetrics;  Laterality: N/A;   LAPAROSCOPIC APPENDECTOMY N/A 12/24/2019   Procedure: APPENDECTOMY LAPAROSCOPIC;  Surgeon: Axel Filler, MD;  Location: [facility] OR;  Service: General;  Laterality: N/A;   NO PAST SURGERIES      Family History family history includes Diabetes in an other family member; Healthy in her father; Heart disease in an other family member; Heart failure in her mother; High blood pressure in her mother; Kidney disease in her maternal grandmother; Migraines in her mother; Panic disorder in her mother. Family History is negative for ***.  Social History Social History   Socioeconomic History   Marital status: Single    Spouse name: Not on file   Number of children: Not on file   Years of education: Not on file   Highest education level: High school graduate  Occupational History   Occupation: Online Business  Tobacco Use   Smoking status: Never    Passive exposure: Never   Smokeless tobacco: Never  Vaping Use   Vaping Use: Some days  Substance and Sexual Activity   Alcohol use: Not Currently    Comment: <1 drink per week  Drug use: Not Currently    Frequency: 1.0 times per week    Types: Marijuana   Sexual activity: Yes    Partners: Male    Birth control/protection: None  Other Topics Concern   Not on file  Social History Narrative   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   Lives with her mother. She has adult siblings that do no live in the home.    Social Determinants of Health   Financial Resource Strain: Unknown (03/20/2019)   Overall Financial Resource Strain (CARDIA)    Difficulty of Paying Living Expenses: Patient declined  Food Insecurity: Unknown (03/20/2019)   Hunger Vital Sign    Worried About Running Out of Food in the Last Year: Patient declined    Ran Out of Food in the Last Year: Patient declined  Transportation Needs: Unknown (03/20/2019)   PRAPARE -  Administrator, Civil Service (Medical): Patient declined    Lack of Transportation (Non-Medical): Patient declined  Physical Activity: Unknown (03/20/2019)   Exercise Vital Sign    Days of Exercise per Week: Patient declined    Minutes of Exercise per Session: Patient declined  Stress: Unknown (03/20/2019)   [employer] of Occupational Health - Occupational Stress Questionnaire    Feeling of Stress : Patient declined  Social Connections: Unknown (03/20/2019)   Social Connection and Isolation Panel [NHANES]    Frequency of Communication with Friends and Family: Patient declined    Frequency of Social Gatherings with Friends and Family: Patient declined    Attends Religious Services: Patient declined    Database administrator or Organizations: Patient declined    Attends Banker Meetings: Patient declined    Marital Status: Patient declined     Allergies  Allergen Reactions   Cephalexin Rash    Physical Exam There were no vitals taken for this visit. ***  Assessment and Plan ***  No orders of the defined types were placed in this encounter.  No orders of the defined types were placed in this encounter.

## 2022-11-19 NOTE — Patient Instructions (Signed)
Jamie Moyer, thank you for joining Tylene Fantasia Ward, PA-C for today's virtual visit.  While this provider is not your primary care provider (PCP), if your PCP is located in our provider database this encounter information will be shared with them immediately following your visit.   A [place] MyChart account gives you access to today's visit and all your visits, tests, and labs performed at [facility] " click here if you don't have a [facility] MyChart account or go to [email or web address]  Consent: (Patient) Jamie Moyer provided verbal consent for this virtual visit at the beginning of the encounter.  Current Medications:  Current Outpatient Medications:    benzonatate (TESSALON) 100 MG capsule, Take 1 capsule (100 mg total) by mouth 3 (three) times daily as needed., Disp: 20 capsule, Rfl: 0   amitriptyline (ELAVIL) 25 MG tablet, TAKE 1 TABLET BY MOUTH EVERY NIGHT, Disp: 30 tablet, Rfl: 6   busPIRone (BUSPAR) 7.5 MG tablet, Take 7.5 mg by mouth daily at 6 (six) AM., Disp: , Rfl:    Ergocalciferol 10 MCG (400 UNIT) TABS, Take 20 mcg by mouth daily at 6 (six) AM., Disp: , Rfl:    fluticasone (FLONASE) 50 MCG/ACT nasal spray, Place 2 sprays into both nostrils daily., Disp: 16 g, Rfl: 6   ibuprofen (ADVIL) 400 MG tablet, Take 400 mg by mouth every 6 (six) hours as needed for fever, headache, mild pain, moderate pain or cramping., Disp: , Rfl:    loratadine (CLARITIN) 10 MG tablet, Take 1 tablet (10 mg total) by mouth daily., Disp: 30 tablet, Rfl: 11   naproxen (NAPROSYN) 375 MG tablet, Take 375 mg by mouth as directed., Disp: , Rfl:    norgestimate-ethinyl estradiol (SPRINTEC 28) 0.25-35 MG-MCG tablet, Take 1 tablet by mouth daily., Disp: 28 tablet, Rfl: 11   topiramate (TOPAMAX) 50 MG tablet, Take 50 mg by mouth 2 (two) times daily., Disp: , Rfl:    valACYclovir (VALTREX) 1000 MG tablet, Take 1 tablet by mouth as directed., Disp: , Rfl:    Medications ordered in  this encounter:  Meds ordered this encounter  Medications   benzonatate (TESSALON) 100 MG capsule    Sig: Take 1 capsule (100 mg total) by mouth 3 (three) times daily as needed.    Dispense:  20 capsule    Refill:  0    Order Specific Question:   Supervising Provider    Answer:   Merrilee Jansky [id number]     *If you need refills on other medications prior to your next appointment, please contact your pharmacy*  Follow-Up: Call back or seek an in-person evaluation if the symptoms worsen or if the condition fails to improve as anticipated.  [facility] [phone]  Other Instructions Recommend daily allergy medication like Claritin or Zyrtec.  I have sent in tessalon that you can take as needed for the cough.  If no improvement or symptoms become worse follow up with PCP or Urgent Care for in person evaluation.    If you have been instructed to have an in-person evaluation today at a local Urgent Care facility, please use the link below. It will take you to a list of all of our available [facility], including address, phone number and hours of operation. Please do not delay care.  [facility]  If you or a family member do not have a primary care provider, use the link below to schedule a visit and establish  care. When you choose a [facility] primary care physician or advanced practice provider, you gain a long-term partner in health. Find a Primary Care Provider  Learn more about [facility] in-office and virtual care options: [facility]

## 2022-11-22 ENCOUNTER — Ambulatory Visit (INDEPENDENT_AMBULATORY_CARE_PROVIDER_SITE_OTHER): Payer: BC Managed Care – PPO | Admitting: Neurology

## 2022-11-22 ENCOUNTER — Encounter (INDEPENDENT_AMBULATORY_CARE_PROVIDER_SITE_OTHER): Payer: Self-pay | Admitting: Neurology

## 2022-11-22 VITALS — BP 116/68 | HR 80 | Ht 58.47 in | Wt 81.6 lb

## 2022-11-22 DIAGNOSIS — R42 Dizziness and giddiness: Secondary | ICD-10-CM

## 2022-11-22 DIAGNOSIS — G44209 Tension-type headache, unspecified, not intractable: Secondary | ICD-10-CM | POA: Diagnosis not present

## 2022-11-22 DIAGNOSIS — F411 Generalized anxiety disorder: Secondary | ICD-10-CM

## 2022-11-22 DIAGNOSIS — G43009 Migraine without aura, not intractable, without status migrainosus: Secondary | ICD-10-CM

## 2022-11-22 NOTE — Patient Instructions (Signed)
Continue with more hydration and slight increase salt intake Continue with better diet and do not skip breakfast Follow-up with primary care physician Return if there are more frequent headaches otherwise no follow-up visit with neurology needed

## 2023-02-21 ENCOUNTER — Telehealth: Payer: BC Managed Care – PPO | Admitting: Nurse Practitioner

## 2023-02-21 DIAGNOSIS — L739 Follicular disorder, unspecified: Secondary | ICD-10-CM

## 2023-02-21 MED ORDER — MUPIROCIN 2 % EX OINT: 1.0000 | TOPICAL_OINTMENT | Freq: Two times a day (BID) | CUTANEOUS | 0 refills | Status: DC

## 2023-02-21 NOTE — Progress Notes (Signed)
Virtual Visit Consent   Jamie Moyer, you are scheduled for a virtual visit with a [facility] provider today. Just as with appointments in the office, your consent must be obtained to participate. Your consent will be active for this visit and any virtual visit you may have with one of our providers in the next 365 days. If you have a MyChart account, a copy of this consent can be sent to you electronically.  As this is a virtual visit, video technology does not allow for your provider to perform a traditional examination. This may limit your provider's ability to fully assess your condition. If your provider identifies any concerns that need to be evaluated in person or the need to arrange testing (such as labs, EKG, etc.), [facility] will make arrangements to do so. Although advances in technology are sophisticated, [facility]oyer cannot ensure that it will always work on either your end or our end. If the connection with a video visit is poor, the visit may have to be switched to a telephone visit. With either a video or telephone visit, Moyer are not always able to ensure that [facility] have a secure connection.  By engaging in this virtual visit, you consent to the provision of healthcare and authorize for your insurance to be billed (if applicable) for the services provided during this visit. Depending on your insurance coverage, you may receive a charge related to this service.  I need to obtain your verbal consent now. Are you willing to proceed with your visit today? DIYORA BRODBECK has provided verbal consent on 02/21/2023 for a virtual visit (video or telephone). Viviano Simas, FNP  Date: 02/21/2023 9:26 AM  Virtual Visit via Video Note   I, Viviano Simas, connected with  Jamie Moyer  ([id number], 2001-03-24) on 02/21/23 at  9:30 AM EDT by a video-enabled telemedicine application and verified that I am speaking with the correct person using two identifiers.  Location: Patient: Virtual Visit Location Patient:  Home Provider: Virtual Visit Location Provider: Home Office   I discussed the limitations of evaluation and management by telemedicine and the availability of in person appointments. The patient expressed understanding and agreed to proceed.    History of Present Illness: Jamie Moyer is a 22 y.o. who identifies as a female who was assigned female at birth, and is being seen today for a possible skin infection.   She was shaving around her C-section scar and since that time has noticed a raised hair follicle  This happened 2-3 days ago. Her scar has remained raised on one side   The area is tender to touch  Denies any drainage She did pull a hair from that area   She believes it may be warmer to touch in that area  She has been using a warm compress   C-section was 2021   Problems:  Patient Active Problem List   Diagnosis Date Noted   Contraceptive management 09/02/2022   Encounter for annual physical exam 09/02/2022   Lightheadedness 07/01/2022   Generalized anxiety disorder 04/25/2019   Allergic rhinitis 01/26/2019   Sebaceous cyst of breast, right 09/02/2017   Breast mass, right 08/23/2017   Left leg pain 07/01/2016   Left leg numbness 07/01/2016   Migraine without aura and without status migrainosus, not intractable 11/17/2015   Anxiety 11/13/2015   IBS (irritable bowel syndrome) 11/13/2015   Chest pain 10/06/2015   GERD (gastroesophageal reflux disease) 10/06/2015   Panic attack 09/29/2015   Precordial catch  syndrome 09/29/2015    Allergies:  Allergies  Allergen Reactions   Cephalexin Rash   Medications:  Current Outpatient Medications:    amitriptyline (ELAVIL) 25 MG tablet, TAKE 1 TABLET BY MOUTH EVERY NIGHT (Patient not taking: Reported on 11/22/2022), Disp: 30 tablet, Rfl: 6   benzonatate (TESSALON) 100 MG capsule, Take 1 capsule (100 mg total) by mouth 3 (three) times daily as needed., Disp: 20 capsule, Rfl: 0   busPIRone (BUSPAR) 7.5 MG tablet, Take 7.5  mg by mouth daily at 6 (six) AM. (Patient not taking: Reported on 11/22/2022), Disp: , Rfl:    Ergocalciferol 10 MCG (400 UNIT) TABS, Take 20 mcg by mouth daily at 6 (six) AM., Disp: , Rfl:    fluticasone (FLONASE) 50 MCG/ACT nasal spray, Place 2 sprays into both nostrils daily., Disp: 16 g, Rfl: 6   ibuprofen (ADVIL) 400 MG tablet, Take 400 mg by mouth every 6 (six) hours as needed for fever, headache, mild pain, moderate pain or cramping., Disp: , Rfl:    loratadine (CLARITIN) 10 MG tablet, Take 1 tablet (10 mg total) by mouth daily., Disp: 30 tablet, Rfl: 11   naproxen (NAPROSYN) 375 MG tablet, Take 375 mg by mouth as directed., Disp: , Rfl:    norgestimate-ethinyl estradiol (SPRINTEC 28) 0.25-35 MG-MCG tablet, Take 1 tablet by mouth daily., Disp: 28 tablet, Rfl: 11   valACYclovir (VALTREX) 1000 MG tablet, Take 1 tablet by mouth as directed., Disp: , Rfl:   Observations/Objective: Patient is well-developed, well-nourished in no acute distress.  Resting comfortably  at home.  Head is normocephalic, atraumatic.  No labored breathing.  Speech is clear and coherent with logical content.  Patient is alert and oriented at baseline.    Assessment and Plan:  1. Folliculitis Continue warm compress to area or Epson salt bath  Stop shaving area   - mupirocin ointment (BACTROBAN) 2 %; Apply 1 Application topically 2 (two) times daily.  Dispense: 22 g; Refill: 0     Follow Up Instructions: I discussed the assessment and treatment plan with the patient. The patient was provided an opportunity to ask questions and all were answered. The patient agreed with the plan and demonstrated an understanding of the instructions.  A copy of instructions were sent to the patient via MyChart unless otherwise noted below.    The patient was advised to call back or seek an in-person evaluation if the symptoms worsen or if the condition fails to improve as anticipated.  Time:  I spent 11 minutes with the patient  via telehealth technology discussing the above problems/concerns.    Viviano Simas, FNP

## 2023-03-04 ENCOUNTER — Telehealth: Payer: BC Managed Care – PPO | Admitting: Nurse Practitioner

## 2023-03-04 DIAGNOSIS — B3731 Acute candidiasis of vulva and vagina: Secondary | ICD-10-CM | POA: Diagnosis not present

## 2023-03-04 MED ORDER — FLUCONAZOLE 150 MG PO TABS: 150.0000 mg | ORAL_TABLET | Freq: Once | ORAL | 0 refills | Status: AC

## 2023-03-04 NOTE — Progress Notes (Signed)
Virtual Visit Consent   Jamie Moyer, you are scheduled for a virtual visit with a [facility] provider today. Just as with appointments in the office, your consent must be obtained to participate. Your consent will be active for this visit and any virtual visit you may have with one of our providers in the next 365 days. If you have a MyChart account, a copy of this consent can be sent to you electronically.  As this is a virtual visit, video technology does not allow for your provider to perform a traditional examination. This may limit your provider's ability to fully assess your condition. If your provider identifies any concerns that need to be evaluated in person or the need to arrange testing (such as labs, EKG, etc.), we will make arrangements to do so. Although advances in technology are sophisticated, we cannot ensure that it will always work on either your end or our end. If the connection with a video visit is poor, the visit may have to be switched to a telephone visit. With either a video or telephone visit, we are not always able to ensure that we have a secure connection.  By engaging in this virtual visit, you consent to the provision of healthcare and authorize for your insurance to be billed (if applicable) for the services provided during this visit. Depending on your insurance coverage, you may receive a charge related to this service.  I need to obtain your verbal consent now. Are you willing to proceed with your visit today? Jamie Moyer has provided verbal consent on 03/04/2023 for a virtual visit (video or telephone). Viviano Simas, FNP  Date: 03/04/2023 4:27 PM  Virtual Visit via Video Note   I, Viviano Simas, connected with  Jamie Moyer  ([id number], 12-12-2000) on 03/04/23 at  4:30 PM EDT by a video-enabled telemedicine application and verified that I am speaking with the correct person using two identifiers.  Location: Patient: Virtual Visit Location Patient:  Home Provider: Virtual Visit Location Provider: Home Office   I discussed the limitations of evaluation and management by telemedicine and the availability of in person appointments. The patient expressed understanding and agreed to proceed.    History of Present Illness: Jamie Moyer is a 22 y.o. who identifies as a female who was assigned female at birth, and is being seen today for symptoms of a yeast infection.   She was treated for folliculitis earlier this month with topical antibiotics only (mupirocin) that she used externally only   She has noted a thicker discharge vaginally  She has not had a new partner   She has used a new feminine wash  that she did use internally   Denies an odor to the discharge     Problems:  Patient Active Problem List   Diagnosis Date Noted   Contraceptive management 09/02/2022   Encounter for annual physical exam 09/02/2022   Lightheadedness 07/01/2022   Generalized anxiety disorder 04/25/2019   Allergic rhinitis 01/26/2019   Sebaceous cyst of breast, right 09/02/2017   Breast mass, right 08/23/2017   Left leg pain 07/01/2016   Left leg numbness 07/01/2016   Migraine without aura and without status migrainosus, not intractable 11/17/2015   Anxiety 11/13/2015   IBS (irritable bowel syndrome) 11/13/2015   Chest pain 10/06/2015   GERD (gastroesophageal reflux disease) 10/06/2015   Panic attack 09/29/2015   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 (Patient not taking:  Reported on 11/22/2022), Disp: , Rfl:    Ergocalciferol 10 MCG (400 UNIT) TABS, Take 20 mcg by mouth daily at 6 (six) AM., Disp: , Rfl:    fluticasone (FLONASE) 50 MCG/ACT nasal spray, Place 2 sprays into both nostrils daily., Disp: 16 g, Rfl: 6   ibuprofen (ADVIL) 400 MG tablet, Take 400 mg by mouth every 6 (six) hours as needed for fever, headache, mild pain, moderate pain or cramping., Disp: , Rfl:    loratadine (CLARITIN) 10 MG tablet, Take 1 tablet (10 mg total) by mouth daily., Disp: 30 tablet, Rfl: 11   mupirocin ointment (BACTROBAN) 2 %, Apply 1 Application topically 2 (two) times daily., Disp: 22 g, Rfl: 0   naproxen (NAPROSYN) 375 MG tablet, Take 375 mg by mouth as directed., Disp: , Rfl:    norgestimate-ethinyl estradiol (SPRINTEC 28) 0.25-35 MG-MCG tablet, Take 1 tablet by mouth daily., Disp: 28 tablet, Rfl: 11   valACYclovir (VALTREX) 1000 MG tablet, Take 1 tablet by mouth as directed., Disp: , Rfl:   Observations/Objective: Patient is well-developed, well-nourished in no acute distress.  Resting comfortably  at home.  Head is normocephalic, atraumatic.  No labored breathing.  Speech is clear and coherent with logical content.  Patient is alert and oriented at baseline.    Assessment and Plan:  1. Vaginal yeast infection  - fluconazole (DIFLUCAN) 150 MG tablet; Take 1 tablet (150 mg total) by mouth once for 1 dose.  Dispense: 1 tablet; Refill: 0     Follow Up Instructions: I discussed the assessment and treatment plan with the patient. The patient was provided an opportunity to ask questions and all were answered. The patient agreed with the plan and demonstrated an understanding of the instructions.  A copy of instructions were sent to the patient via MyChart unless otherwise noted below.    The patient was advised to call back or seek an in-person evaluation if the symptoms worsen or if the condition fails to improve as anticipated.  Time:  I spent 10 minutes with the  patient via telehealth technology discussing the above problems/concerns.    Viviano Simas, FNP

## 2023-04-16 ENCOUNTER — Telehealth: Payer: BC Managed Care – PPO | Admitting: Family Medicine

## 2023-04-16 DIAGNOSIS — B3731 Acute candidiasis of vulva and vagina: Secondary | ICD-10-CM

## 2023-04-16 MED ORDER — FLUCONAZOLE 150 MG PO TABS: 150.0000 mg | ORAL_TABLET | Freq: Once | ORAL | 0 refills | Status: AC

## 2023-04-16 NOTE — Patient Instructions (Signed)
Jamie Moyer, thank you for joining Reed Pandy, PA-C for today's virtual visit.  While this provider is not your primary care provider (PCP), if your PCP is located in our provider database this encounter information will be shared with them immediately following your visit.   A [facility] MyChart account gives you access to today's visit and all your visits, tests, and labs performed at [facility] " click here if you don't have a [facility] MyChart account or go to [email or web address]  Consent: (Patient) Jamie Moyer provided verbal consent for this virtual visit at the beginning of the encounter.  Current Medications:  Current Outpatient Medications:    fluconazole (DIFLUCAN) 150 MG tablet, Take 1 tablet (150 mg total) by mouth once for 1 dose., Disp: 1 tablet, Rfl: 0   amitriptyline (ELAVIL) 25 MG tablet, TAKE 1 TABLET BY MOUTH EVERY NIGHT (Patient not taking: Reported on 11/22/2022), Disp: 30 tablet, Rfl: 6   benzonatate (TESSALON) 100 MG capsule, Take 1 capsule (100 mg total) by mouth 3 (three) times daily as needed., Disp: 20 capsule, Rfl: 0   busPIRone (BUSPAR) 7.5 MG tablet, Take 7.5 mg by mouth daily at 6 (six) AM. (Patient not taking: Reported on 11/22/2022), Disp: , Rfl:    Ergocalciferol 10 MCG (400 UNIT) TABS, Take 20 mcg by mouth daily at 6 (six) AM., Disp: , Rfl:    fluticasone (FLONASE) 50 MCG/ACT nasal spray, Place 2 sprays into both nostrils daily., Disp: 16 g, Rfl: 6   ibuprofen (ADVIL) 400 MG tablet, Take 400 mg by mouth every 6 (six) hours as needed for fever, headache, mild pain, moderate pain or cramping., Disp: , Rfl:    loratadine (CLARITIN) 10 MG tablet, Take 1 tablet (10 mg total) by mouth daily., Disp: 30 tablet, Rfl: 11   mupirocin ointment (BACTROBAN) 2 %, Apply 1 Application topically 2 (two) times daily., Disp: 22 g, Rfl: 0   naproxen (NAPROSYN) 375 MG tablet, Take 375 mg by mouth as directed., Disp: , Rfl:    norgestimate-ethinyl  estradiol (SPRINTEC 28) 0.25-35 MG-MCG tablet, Take 1 tablet by mouth daily., Disp: 28 tablet, Rfl: 11   valACYclovir (VALTREX) 1000 MG tablet, Take 1 tablet by mouth as directed., Disp: , Rfl:    Medications ordered in this encounter:  Meds ordered this encounter  Medications   fluconazole (DIFLUCAN) 150 MG tablet    Sig: Take 1 tablet (150 mg total) by mouth once for 1 dose.    Dispense:  1 tablet    Refill:  0     *If you need refills on other medications prior to your next appointment, please contact your pharmacy*  Follow-Up: Call back or seek an in-person evaluation if the symptoms worsen or if the condition fails to improve as anticipated.  [facility] [phone]  Other Instructions Vaginal Yeast Infection, Adult  Vaginal yeast infection is a condition that causes vaginal discharge as well as soreness, swelling, and redness (inflammation) of the vagina. This is a common condition. Some women get this infection frequently. What are the causes? This condition is caused by a change in the normal balance of the yeast (Candida) and normal bacteria that live in the vagina. This change causes an overgrowth of yeast, which causes the inflammation. What increases the risk? The condition is more likely to develop in women who: Take antibiotic medicines. Have diabetes. Take birth control pills. Are pregnant. Douche often. Have a weak body defense system (immune system). Have been  taking steroid medicines for a long time. Frequently wear tight clothing. What are the signs or symptoms? Symptoms of this condition include: White, thick, creamy vaginal discharge. Swelling, itching, redness, and irritation of the vagina. The lips of the vagina (labia) may be affected as well. Pain or a burning feeling while urinating. Pain during sex. How is this diagnosed? This condition is diagnosed based on: Your medical history. A physical exam. A pelvic exam. Your health care  provider will examine a sample of your vaginal discharge under a microscope. Your health care provider may send this sample for testing to confirm the diagnosis. How is this treated? This condition is treated with medicine. Medicines may be over-the-counter or prescription. You may be told to use one or more of the following: Medicine that is taken by mouth (orally). Medicine that is applied as a cream (topically). Medicine that is inserted directly into the vagina (suppository). Follow these instructions at home: Take or apply over-the-counter and prescription medicines only as told by your health care provider. Do not use tampons until your health care provider approves. Do not have sex until your infection has cleared. Sex can prolong or worsen your symptoms of infection. Ask your health care provider when it is safe to resume sexual activity. Keep all follow-up visits. This is important. How is this prevented?  Do not wear tight clothes, such as pantyhose or tight pants. Wear breathable cotton underwear. Do not use douches, perfumed soap, creams, or powders. Wipe from front to back after using the toilet. If you have diabetes, keep your blood sugar levels under control. Ask your health care provider for other ways to prevent yeast infections. Contact a health care provider if: You have a fever. Your symptoms go away and then return. Your symptoms do not get better with treatment. Your symptoms get worse. You have new symptoms. You develop blisters in or around your vagina. You have blood coming from your vagina and it is not your menstrual period. You develop pain in your abdomen. Summary Vaginal yeast infection is a condition that causes discharge as well as soreness, swelling, and redness (inflammation) of the vagina. This condition is treated with medicine. Medicines may be over-the-counter or prescription. Take or apply over-the-counter and prescription medicines only as told  by your health care provider. Do not douche. Resume sexual activity or use of tampons as instructed by your health care provider. Contact a health care provider if your symptoms do not get better with treatment or your symptoms go away and then return. This information is not intended to replace advice given to you by your health care provider. Make sure you discuss any questions you have with your health care provider. Document Revised: 09/22/2020 Document Reviewed: 09/22/2020 Elsevier Patient Education  2024 [employer]vier Inc.    If you have been instructed to have an in-person evaluation today at a local Urgent Care facility, please use the link below. It will take you to a list of all of our available [facility], including address, phone number and hours of operation. Please do not delay care.  [facility]  If you or a family member do not have a primary care provider, use the link below to schedule a visit and establish care. When you choose a [facility] primary care physician or advanced practice provider, you gain a long-term partner in health. Find a Primary Care Provider  Learn more about [facility]'s in-office and virtual care options: [place] -  Get Care Now

## 2023-04-16 NOTE — Progress Notes (Signed)
Virtual Visit Consent   Jamie Moyer, you are scheduled for a virtual visit with a [facility] provider today. Just as with appointments in the office, your consent must be obtained to participate. Your consent will be active for this visit and any virtual visit you may have with one of our providers in the next 365 days. If you have a MyChart account, a copy of this consent can be sent to you electronically.  As this is a virtual visit, video technology does not allow for your provider to perform a traditional examination. This may limit your provider's ability to fully assess your condition. If your provider identifies any concerns that need to be evaluated in person or the need to arrange testing (such as labs, EKG, etc.), we will make arrangements to do so. Although advances in technology are sophisticated, we cannot ensure that it will always work on either your end or our end. If the connection with a video visit is poor, the visit may have to be switched to a telephone visit. With either a video or telephone visit, we are not always able to ensure that we have a secure connection.  By engaging in this virtual visit, you consent to the provision of healthcare and authorize for your insurance to be billed (if applicable) for the services provided during this visit. Depending on your insurance coverage, you may receive a charge related to this service.  I need to obtain your verbal consent now. Are you willing to proceed with your visit today? STARSHA SCHOENE has provided verbal consent on 04/16/2023 for a virtual visit (video or telephone). Jamie Moyer, [place]  Date: 04/16/2023 11:15 AM  Virtual Visit via Video Note   [name], connected with  EZMAE LIMBERT  ([id number], Jun 15, 2001) on 04/16/23 at 11:15 AM EDT by a video-enabled telemedicine application and verified that I am speaking with the correct person using two identifiers.  Location: Patient: Virtual Visit Location Patient:  Home Provider: Virtual Visit Location Provider: Home Office   I discussed the limitations of evaluation and management by telemedicine and the availability of in person appointments. The patient expressed understanding and agreed to proceed.    History of Present Illness: Jamie Moyer is a 22 y.o. who identifies as a female who was assigned female at birth, and is being seen today for c/o yeast infection.  Pt states she had one about a month or two ago.  Pt states she started having symptoms a day or two ago.  Pt c/o clumpy discharge. Pt denied fever, N/V.   HPI: HPI  Problems:  Patient Active Problem List   Diagnosis Date Noted   Contraceptive management 09/02/2022   Encounter for annual physical exam 09/02/2022   Lightheadedness 07/01/2022   Generalized anxiety disorder 04/25/2019   Allergic rhinitis 01/26/2019   Sebaceous cyst of breast, right 09/02/2017   Breast mass, right 08/23/2017   Left leg pain 07/01/2016   Left leg numbness 07/01/2016   Migraine without aura and without status migrainosus, not intractable 11/17/2015   Anxiety 11/13/2015   IBS (irritable bowel syndrome) 11/13/2015   Chest pain 10/06/2015   GERD (gastroesophageal reflux disease) 10/06/2015   Panic attack 09/29/2015   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 (Patient not taking: Reported on 11/22/2022), Disp: , Rfl:     Ergocalciferol 10 MCG (400 UNIT) TABS, Take 20 mcg by mouth daily at 6 (six) AM., Disp: , Rfl:    fluticasone (FLONASE) 50 MCG/ACT nasal spray, Place 2 sprays into both nostrils daily., Disp: 16 g, Rfl: 6   ibuprofen (ADVIL) 400 MG tablet, Take 400 mg by mouth every 6 (six) hours as needed for fever, headache, mild pain, moderate pain or cramping., Disp: , Rfl:    loratadine (CLARITIN) 10 MG tablet, Take 1 tablet (10 mg total) by mouth daily., Disp: 30 tablet, Rfl: 11   mupirocin ointment (BACTROBAN) 2 %, Apply 1 Application topically 2 (two) times daily., Disp: 22 g, Rfl: 0   naproxen (NAPROSYN) 375 MG tablet, Take 375 mg by mouth as directed., Disp: , Rfl:    norgestimate-ethinyl estradiol (SPRINTEC 28) 0.25-35 MG-MCG tablet, Take 1 tablet by mouth daily., Disp: 28 tablet, Rfl: 11   valACYclovir (VALTREX) 1000 MG tablet, Take 1 tablet by mouth as directed., Disp: , Rfl:   Observations/Objective: Patient is well-developed, well-nourished in no acute distress.  Resting comfortably at home.  Head is normocephalic, atraumatic.  No labored breathing.  Speech is clear and coherent with logical content.  Patient is alert and oriented at baseline.    Assessment and Plan: 1. Vaginal yeast infection - fluconazole (DIFLUCAN) 150 MG tablet; Take 1 tablet (150 mg total) by mouth once for 1 dose.  Dispense: 1 tablet; Refill: 0  -Start one dose of Fluconazole -Pt denied pregnancy  -Advised Pt to follow up with PCP for worsening or continued symptoms.   Follow Up Instructions: I discussed the assessment and treatment plan with the patient. The patient was provided an opportunity to ask questions and all were answered. The patient agreed with the plan and demonstrated an understanding of the instructions.  A copy of instructions were sent to the patient via MyChart unless otherwise noted below.     The patient was advised to call back or seek an in-person evaluation if the symptoms worsen or if the  condition fails to improve as anticipated.  Time:  I spent 10 minutes with the patient via telehealth technology discussing the above problems/concerns.    Jamie Pandy, PA-C

## 2023-05-08 ENCOUNTER — Telehealth: Payer: BC Managed Care – PPO | Admitting: Family Medicine

## 2023-05-08 DIAGNOSIS — J029 Acute pharyngitis, unspecified: Secondary | ICD-10-CM

## 2023-05-08 NOTE — Patient Instructions (Signed)

## 2023-05-08 NOTE — Progress Notes (Signed)
Virtual Visit Consent   Jamie Moyer, you are scheduled for a virtual visit with a [facility] provider today. Just as with appointments in the office, your consent must be obtained to participate. Your consent will be active for this visit and any virtual visit you may have with one of our providers in the next 365 days. If you have a MyChart account, a copy of this consent can be sent to you electronically.  As this is a virtual visit, video technology does not allow for your provider to perform a traditional examination. This may limit your provider's ability to fully assess your condition. If your provider identifies any concerns that need to be evaluated in person or the need to arrange testing (such as labs, EKG, etc.), we will make arrangements to do so. Although advances in technology are sophisticated, we cannot ensure that it will always work on either your end or our end. If the connection with a video visit is poor, the visit may have to be switched to a telephone visit. With either a video or telephone visit, we are not always able to ensure that we have a secure connection.  By engaging in this virtual visit, you consent to the provision of healthcare and authorize for your insurance to be billed (if applicable) for the services provided during this visit. Depending on your insurance coverage, you may receive a charge related to this service.  I need to obtain your verbal consent now. Are you willing to proceed with your visit today? Jamie Moyer has provided verbal consent on 05/08/2023 for a virtual visit (video or telephone). Georgana Curio, FNP  Date: 05/08/2023 11:51 AM  Virtual Visit via Video Note   I, Georgana Curio, connected with  Jamie Moyer  ([id number], December 18, 2000) on 05/08/23 at 11:45 AM EDT by a video-enabled telemedicine application and verified that I am speaking with the correct person using two identifiers.  Location: Patient: Virtual Visit Location Patient:  Home Provider: Virtual Visit Location Provider: Home Office   I discussed the limitations of evaluation and management by telemedicine and the availability of in person appointments. The patient expressed understanding and agreed to proceed.    History of Present Illness: Jamie Moyer is a 22 y.o. who identifies as a female who was assigned female at birth, and is being seen today for sore throat since last night. Slight nasal congestion. No fever. Daughter has been sick. Marland Kitchen  HPI: HPI  Problems:  Patient Active Problem List   Diagnosis Date Noted   Contraceptive management 09/02/2022   Encounter for annual physical exam 09/02/2022   Lightheadedness 07/01/2022   Generalized anxiety disorder 04/25/2019   Allergic rhinitis 01/26/2019   Sebaceous cyst of breast, right 09/02/2017   Breast mass, right 08/23/2017   Left leg pain 07/01/2016   Left leg numbness 07/01/2016   Migraine without aura and without status migrainosus, not intractable 11/17/2015   Anxiety 11/13/2015   IBS (irritable bowel syndrome) 11/13/2015   Chest pain 10/06/2015   GERD (gastroesophageal reflux disease) 10/06/2015   Panic attack 09/29/2015   Precordial catch syndrome 09/29/2015    Allergies:  Allergies  Allergen Reactions   Cephalexin Rash   Medications:  Current Outpatient Medications:    amitriptyline (ELAVIL) 25 MG tablet, TAKE 1 TABLET BY MOUTH EVERY NIGHT (Patient not taking: Reported on 11/22/2022), Disp: 30 tablet, Rfl: 6   benzonatate (TESSALON) 100 MG capsule, Take 1 capsule (100 mg total) by mouth 3 (three) times daily  as needed., Disp: 20 capsule, Rfl: 0   busPIRone (BUSPAR) 7.5 MG tablet, Take 7.5 mg by mouth daily at 6 (six) AM. (Patient not taking: Reported on 11/22/2022), Disp: , Rfl:    Ergocalciferol 10 MCG (400 UNIT) TABS, Take 20 mcg by mouth daily at 6 (six) AM., Disp: , Rfl:    fluticasone (FLONASE) 50 MCG/ACT nasal spray, Place 2 sprays into both nostrils daily., Disp: 16 g, Rfl: 6    ibuprofen (ADVIL) 400 MG tablet, Take 400 mg by mouth every 6 (six) hours as needed for fever, headache, mild pain, moderate pain or cramping., Disp: , Rfl:    loratadine (CLARITIN) 10 MG tablet, Take 1 tablet (10 mg total) by mouth daily., Disp: 30 tablet, Rfl: 11   mupirocin ointment (BACTROBAN) 2 %, Apply 1 Application topically 2 (two) times daily., Disp: 22 g, Rfl: 0   naproxen (NAPROSYN) 375 MG tablet, Take 375 mg by mouth as directed., Disp: , Rfl:    norgestimate-ethinyl estradiol (SPRINTEC 28) 0.25-35 MG-MCG tablet, Take 1 tablet by mouth daily., Disp: 28 tablet, Rfl: 11   valACYclovir (VALTREX) 1000 MG tablet, Take 1 tablet by mouth as directed., Disp: , Rfl:   Observations/Objective: Patient is well-developed, well-nourished in no acute distress.  Resting comfortably  at home.  Head is normocephalic, atraumatic.  No labored breathing.  Speech is clear and coherent with logical content.  Patient is alert and oriented at baseline.    Assessment and Plan: 1. Pharyngitis, unspecified etiology  Increase fuids, warm salt water gargles, ibuprofen, treat allergies. Discussed viral illnesses and allergies.   Follow Up Instructions: I discussed the assessment and treatment plan with the patient. The patient was provided an opportunity to ask questions and all were answered. The patient agreed with the plan and demonstrated an understanding of the instructions.  A copy of instructions were sent to the patient via MyChart unless otherwise noted below.     The patient was advised to call back or seek an in-person evaluation if the symptoms worsen or if the condition fails to improve as anticipated.    Georgana Curio, FNP

## 2023-05-09 ENCOUNTER — Telehealth: Payer: BC Managed Care – PPO | Admitting: Family

## 2023-05-09 DIAGNOSIS — J069 Acute upper respiratory infection, unspecified: Secondary | ICD-10-CM | POA: Diagnosis not present

## 2023-05-09 MED ORDER — IPRATROPIUM BROMIDE 0.06 % NA SOLN: 2.0000 | Freq: Four times a day (QID) | NASAL | 12 refills | Status: DC

## 2023-05-09 MED ORDER — CETIRIZINE HCL 10 MG PO TABS: 10.0000 mg | ORAL_TABLET | Freq: Every day | ORAL | 1 refills | Status: DC

## 2023-05-09 MED ORDER — BENZONATATE 100 MG PO CAPS: 100.0000 mg | ORAL_CAPSULE | Freq: Three times a day (TID) | ORAL | 0 refills | Status: DC | PRN

## 2023-05-09 NOTE — Patient Instructions (Signed)

## 2023-05-09 NOTE — Progress Notes (Signed)
Virtual Visit Consent   Jamie Moyer, you are scheduled for a virtual visit with a [facility] provider today. Just as with appointments in the office, your consent must be obtained to participate. Your consent will be active for this visit and any virtual visit you may have with one of our providers in the next 365 days. If you have a MyChart account, a copy of this consent can be sent to you electronically.  As this is a virtual visit, video technology does not allow for your provider to perform a traditional examination. This may limit your provider's ability to fully assess your condition. If your provider identifies any concerns that need to be evaluated in person or the need to arrange testing (such as labs, EKG, etc.), we will make arrangements to do so. Although advances in technology are sophisticated, we cannot ensure that it will always work on either your end or our end. If the connection with a video visit is poor, the visit may have to be switched to a telephone visit. With either a video or telephone visit, we are not always able to ensure that we have a secure connection.  By engaging in this virtual visit, you consent to the provision of healthcare and authorize for your insurance to be billed (if applicable) for the services provided during this visit. Depending on your insurance coverage, you may receive a charge related to this service.  I need to obtain your verbal consent now. Are you willing to proceed with your visit today? Jamie Moyer has provided verbal consent on 05/09/2023 for a virtual visit (video or telephone). Jamie Rodney, FNP  Date: 05/09/2023 7:14 PM  Virtual Visit via Video Note   I, Jamie Moyer, connected with  Jamie Moyer  ([id number], [birth year]) on 05/09/23 at  7:15 PM EDT by a video-enabled telemedicine application and verified that I am speaking with the correct person using two identifiers.  Location: Patient: Virtual Visit Location Patient:  Home Provider: Virtual Visit Location Provider: Home Office   I discussed the limitations of evaluation and management by telemedicine and the availability of in person appointments. The patient expressed understanding and agreed to proceed.    History of Present Illness: Jamie Moyer is a 22 y.o. who identifies as a female who was assigned female at birth, and is being seen today for flu like symptoms that started three days ago.  HPI: URI  This is a new problem. The current episode started in the past 7 days. The problem has been unchanged. Maximum temperature: 100.1 F. Associated symptoms include congestion, headaches, rhinorrhea, sinus pain, sneezing and a sore throat. Pertinent negatives include no coughing or ear pain. She has tried acetaminophen for the symptoms. The treatment provided mild relief.    Problems:  Patient Active Problem List   Diagnosis Date Noted   Contraceptive management 09/02/2022   Encounter for annual physical exam 09/02/2022   Lightheadedness 07/01/2022   Generalized anxiety disorder 04/25/2019   Allergic rhinitis 01/26/2019   Sebaceous cyst of breast, right 09/02/2017   Breast mass, right 08/23/2017   Left leg pain 07/01/2016   Left leg numbness 07/01/2016   Migraine without aura and without status migrainosus, not intractable 11/17/2015   Anxiety 11/13/2015   IBS (irritable bowel syndrome) 11/13/2015   Chest pain 10/06/2015   GERD (gastroesophageal reflux disease) 10/06/2015   Panic attack 09/29/2015   Precordial catch syndrome 09/29/2015    Allergies:  Allergies  Allergen Reactions   Cephalexin  Rash   Medications:  Current Outpatient Medications:    benzonatate (TESSALON PERLES) 100 MG capsule, Take 1 capsule (100 mg total) by mouth 3 (three) times daily as needed., Disp: 20 capsule, Rfl: 0   cetirizine (ZYRTEC ALLERGY) 10 MG tablet, Take 1 tablet (10 mg total) by mouth daily., Disp: 90 tablet, Rfl: 1   ipratropium (ATROVENT) 0.06 % nasal  spray, Place 2 sprays into both nostrils 4 (four) times daily., Disp: 15 mL, Rfl: 12   Ergocalciferol 10 MCG (400 UNIT) TABS, Take 20 mcg by mouth daily at 6 (six) AM., Disp: , Rfl:    ibuprofen (ADVIL) 400 MG tablet, Take 400 mg by mouth every 6 (six) hours as needed for fever, headache, mild pain, moderate pain or cramping., Disp: , Rfl:    mupirocin ointment (BACTROBAN) 2 %, Apply 1 Application topically 2 (two) times daily., Disp: 22 g, Rfl: 0   valACYclovir (VALTREX) 1000 MG tablet, Take 1 tablet by mouth as directed., Disp: , Rfl:   Observations/Objective: Patient is well-developed, well-nourished in no acute distress.  Resting comfortably  at home.  Head is normocephalic, atraumatic.  No labored breathing.  Speech is clear and coherent with logical content.  Patient is alert and oriented at baseline.  Nasal congestion  Assessment and Plan: 1. Viral URI - cetirizine (ZYRTEC ALLERGY) 10 MG tablet; Take 1 tablet (10 mg total) by mouth daily.  Dispense: 90 tablet; Refill: 1 - benzonatate (TESSALON PERLES) 100 MG capsule; Take 1 capsule (100 mg total) by mouth 3 (three) times daily as needed.  Dispense: 20 capsule; Refill: 0 - ipratropium (ATROVENT) 0.06 % nasal spray; Place 2 sprays into both nostrils 4 (four) times daily.  Dispense: 15 mL; Refill: 12  - Take meds as prescribed - Use a cool mist humidifier  -Use saline nose sprays frequently -Force fluids -For any cough or congestion  Use plain Mucinex- regular strength or max strength is fine -For fever or aces or pains- take tylenol or ibuprofen. -Throat lozenges if help -Stop claritin and start zyrtec, atrovent, and tessalon as needed Work note given  Follow up if symptoms worsen or do not improve   Follow Up Instructions: I discussed the assessment and treatment plan with the patient. The patient was provided an opportunity to ask questions and all were answered. The patient agreed with the plan and demonstrated an  understanding of the instructions.  A copy of instructions were sent to the patient via MyChart unless otherwise noted below.     The patient was advised to call back or seek an in-person evaluation if the symptoms worsen or if the condition fails to improve as anticipated.    Jamie Rodney, FNP

## 2023-05-16 ENCOUNTER — Telehealth: Payer: BC Managed Care – PPO | Admitting: Physician Assistant

## 2023-05-16 DIAGNOSIS — B3731 Acute candidiasis of vulva and vagina: Secondary | ICD-10-CM | POA: Diagnosis not present

## 2023-05-16 MED ORDER — FLUCONAZOLE 150 MG PO TABS: 150.0000 mg | ORAL_TABLET | ORAL | 0 refills | Status: DC | PRN

## 2023-05-16 NOTE — Progress Notes (Signed)
Virtual Visit Consent   ADDALEY PALOMERA, you are scheduled for a virtual visit with a [facility] provider today. Just as with appointments in the office, your consent must be obtained to participate. Your consent will be active for this visit and any virtual visit you may have with one of our providers in the next 365 days. If you have a MyChart account, a copy of this consent can be sent to you electronically.  As this is a virtual visit, video technology does not allow for your provider to perform a traditional examination. This may limit your provider's ability to fully assess your condition. If your provider identifies any concerns that need to be evaluated in person or the need to arrange testing (such as labs, EKG, etc.), we will make arrangements to do so. Although advances in technology are sophisticated, we cannot ensure that it will always work on either your end or our end. If the connection with a video visit is poor, the visit may have to be switched to a telephone visit. With either a video or telephone visit, we are not always able to ensure that we have a secure connection.  By engaging in this virtual visit, you consent to the provision of healthcare and authorize for your insurance to be billed (if applicable) for the services provided during this visit. Depending on your insurance coverage, you may receive a charge related to this service.  I need to obtain your verbal consent now. Are you willing to proceed with your visit today? Jamie Moyer has provided verbal consent on 05/16/2023 for a virtual visit (video or telephone). Margaretann Loveless, PA-C  Date: 05/16/2023 4:58 PM  Virtual Visit via Video Note   IMargaretann Loveless, connected with  Jamie Moyer  ([id number], 2001/06/27) on 05/16/23 at  5:00 PM EDT by a video-enabled telemedicine application and verified that I am speaking with the correct person using two identifiers.  Location: Patient: Virtual Visit  Location Patient: Home Provider: Virtual Visit Location Provider: Home Office   I discussed the limitations of evaluation and management by telemedicine and the availability of in person appointments. The patient expressed understanding and agreed to proceed.    History of Present Illness: Jamie Moyer is a 22 y.o. who identifies as a female who was assigned female at birth, and is being seen today for vaginal itching.  HPI: Vaginal Discharge The patient's primary symptoms include genital itching and vaginal discharge. The patient's pertinent negatives include no genital lesions, genital odor, genital rash, missed menses, pelvic pain or vaginal bleeding. This is a new problem. The current episode started in the past 7 days (2-3 days ago). The problem occurs constantly. The problem has been gradually worsening. Pertinent negatives include no back pain, chills, dysuria, fever, hematuria, nausea or vomiting. The vaginal discharge was white and thick (clumpy). There has been no bleeding. She has not been passing clots. She has not been passing tissue. The symptoms are aggravated by tactile pressure. She has tried nothing for the symptoms. The treatment provided no relief.     Problems:  Patient Active Problem List   Diagnosis Date Noted   Contraceptive management 09/02/2022   Encounter for annual physical exam 09/02/2022   Lightheadedness 07/01/2022   Generalized anxiety disorder 04/25/2019   Allergic rhinitis 01/26/2019   Sebaceous cyst of breast, right 09/02/2017   Breast mass, right 08/23/2017   Left leg pain 07/01/2016   Left leg numbness 07/01/2016   Migraine without  aura and without status migrainosus, not intractable 11/17/2015   Anxiety 11/13/2015   IBS (irritable bowel syndrome) 11/13/2015   Chest pain 10/06/2015   GERD (gastroesophageal reflux disease) 10/06/2015   Panic attack 09/29/2015   Precordial catch syndrome 09/29/2015    Allergies:  Allergies  Allergen Reactions    Cephalexin Rash   Medications:  Current Outpatient Medications:    fluconazole (DIFLUCAN) 150 MG tablet, Take 1 tablet (150 mg total) by mouth every 3 (three) days as needed., Disp: 2 tablet, Rfl: 0   benzonatate (TESSALON PERLES) 100 MG capsule, Take 1 capsule (100 mg total) by mouth 3 (three) times daily as needed., Disp: 20 capsule, Rfl: 0   cetirizine (ZYRTEC ALLERGY) 10 MG tablet, Take 1 tablet (10 mg total) by mouth daily., Disp: 90 tablet, Rfl: 1   Ergocalciferol 10 MCG (400 UNIT) TABS, Take 20 mcg by mouth daily at 6 (six) AM., Disp: , Rfl:    ibuprofen (ADVIL) 400 MG tablet, Take 400 mg by mouth every 6 (six) hours as needed for fever, headache, mild pain, moderate pain or cramping., Disp: , Rfl:    ipratropium (ATROVENT) 0.06 % nasal spray, Place 2 sprays into both nostrils 4 (four) times daily., Disp: 15 mL, Rfl: 12   mupirocin ointment (BACTROBAN) 2 %, Apply 1 Application topically 2 (two) times daily., Disp: 22 g, Rfl: 0   valACYclovir (VALTREX) 1000 MG tablet, Take 1 tablet by mouth as directed., Disp: , Rfl:   Observations/Objective: Patient is well-developed, well-nourished in no acute distress.  Resting comfortably at home.  Head is normocephalic, atraumatic.  No labored breathing.  Speech is clear and coherent with logical content.  Patient is alert and oriented at baseline.    Assessment and Plan: 1. Vaginal yeast infection - fluconazole (DIFLUCAN) 150 MG tablet; Take 1 tablet (150 mg total) by mouth every 3 (three) days as needed.  Dispense: 2 tablet; Refill: 0  - Symptoms consistent with yeast vaginitis - Fluconazole prescribed - Limit bubble baths, scented lotions/soaps/detergents - Limit tight fitting clothing - Seek on person evaluation if not improving or if symptoms worsen   Follow Up Instructions: I discussed the assessment and treatment plan with the patient. The patient was provided an opportunity to ask questions and all were answered. The patient  agreed with the plan and demonstrated an understanding of the instructions.  A copy of instructions were sent to the patient via MyChart unless otherwise noted below.    The patient was advised to call back or seek an in-person evaluation if the symptoms worsen or if the condition fails to improve as anticipated.    Margaretann Loveless, PA-C

## 2023-05-16 NOTE — Patient Instructions (Addendum)
  Jamie Moyer, thank you for joining Margaretann Loveless, PA-C for today's virtual visit.  While this provider is not your primary care provider (PCP), if your PCP is located in our provider database this encounter information will be shared with them immediately following your visit.   A [facility] MyChart account gives you access to today's visit and all your visits, tests, and labs performed at [facility] " click here if you don't have a [facility] MyChart account or go to [email or web address]  Consent: (Patient) Jamie Moyer provided verbal consent for this virtual visit at the beginning of the encounter.  Current Medications:  Current Outpatient Medications:    fluconazole (DIFLUCAN) 150 MG tablet, Take 1 tablet (150 mg total) by mouth every 3 (three) days as needed., Disp: 2 tablet, Rfl: 0   benzonatate (TESSALON PERLES) 100 MG capsule, Take 1 capsule (100 mg total) by mouth 3 (three) times daily as needed., Disp: 20 capsule, Rfl: 0   cetirizine (ZYRTEC ALLERGY) 10 MG tablet, Take 1 tablet (10 mg total) by mouth daily., Disp: 90 tablet, Rfl: 1   Ergocalciferol 10 MCG (400 UNIT) TABS, Take 20 mcg by mouth daily at 6 (six) AM., Disp: , Rfl:    ibuprofen (ADVIL) 400 MG tablet, Take 400 mg by mouth every 6 (six) hours as needed for fever, headache, mild pain, moderate pain or cramping., Disp: , Rfl:    ipratropium (ATROVENT) 0.06 % nasal spray, Place 2 sprays into both nostrils 4 (four) times daily., Disp: 15 mL, Rfl: 12   mupirocin ointment (BACTROBAN) 2 %, Apply 1 Application topically 2 (two) times daily., Disp: 22 g, Rfl: 0   valACYclovir (VALTREX) 1000 MG tablet, Take 1 tablet by mouth as directed., Disp: , Rfl:    Medications ordered in this encounter:  Meds ordered this encounter  Medications   fluconazole (DIFLUCAN) 150 MG tablet    Sig: Take 1 tablet (150 mg total) by mouth every 3 (three) days as needed.    Dispense:  2 tablet    Refill:  0    Order  Specific Question:   Supervising Provider    Answer:   Merrilee Jansky [id number]     *If you need refills on other medications prior to your next appointment, please contact your pharmacy*  Follow-Up: Call back or seek an in-person evaluation if the symptoms worsen or if the condition fails to improve as anticipated.  [facility] [phone]  Other Instructions  Vaginal Probiotics: AZO vaginal probiotic OLLY Happy Hoo-Ha RAW Vaginal Care RenewLife Women's vaginal probiotic RepHresh Pro-B  Vaginal washes: Honey Pot Summer's Eve Vagisil Feminine cleanser  Boric acid vaginal suppositories   If you have been instructed to have an in-person evaluation today at a local Urgent Care facility, please use the link below. It will take you to a list of all of our available [facility], including address, phone number and hours of operation. Please do not delay care.  [facility]  If you or a family member do not have a primary care provider, use the link below to schedule a visit and establish care. When you choose a [facility] primary care physician or advanced practice provider, you gain a long-term partner in health. Find a Primary Care Provider  Learn more about [facility]'s in-office and virtual care options: [facility] - Get Care Now

## 2023-05-30 ENCOUNTER — Ambulatory Visit: Payer: BC Managed Care – PPO | Admitting: Student
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
## 2023-06-08 ENCOUNTER — Ambulatory Visit: Payer: BC Managed Care – PPO | Admitting: Student
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
## 2023-06-17 ENCOUNTER — Telehealth: Payer: BC Managed Care – PPO

## 2023-06-22 ENCOUNTER — Telehealth: Payer: BC Managed Care – PPO | Admitting: Physician Assistant

## 2023-06-22 DIAGNOSIS — R52 Pain, unspecified: Secondary | ICD-10-CM

## 2023-06-22 MED ORDER — MELOXICAM 15 MG PO TABS: 15.0000 mg | ORAL_TABLET | Freq: Every day | ORAL | 0 refills | Status: AC

## 2023-06-22 NOTE — Progress Notes (Signed)
Virtual Visit Consent   Jamie Moyer, you are scheduled for a virtual visit with a [facility] provider today. Just as with appointments in the office, your consent must be obtained to participate. Your consent will be active for this visit and any virtual visit you may have with one of our providers in the next 365 days. If you have a MyChart account, a copy of this consent can be sent to you electronically.  As this is a virtual visit, video technology does not allow for your provider to perform a traditional examination. This may limit your provider's ability to fully assess your condition. If your provider identifies any concerns that need to be evaluated in person or the need to arrange testing (such as labs, EKG, etc.), we will make arrangements to do so. Although advances in technology are sophisticated, we cannot ensure that it will always work on either your end or our end. If the connection with a video visit is poor, the visit may have to be switched to a telephone visit. With either a video or telephone visit, we are not always able to ensure that we have a secure connection.  By engaging in this virtual visit, you consent to the provision of healthcare and authorize for your insurance to be billed (if applicable) for the services provided during this visit. Depending on your insurance coverage, you may receive a charge related to this service.  I need to obtain your verbal consent now. Are you willing to proceed with your visit today? Jamie Moyer has provided verbal consent on 06/22/2023 for a virtual visit (video or telephone). Jamie Moyer, [place]  Date: 06/22/2023 7:46 PM  Virtual Visit via Video Note   I, Jamie Moyer, connected with  [name]  ([id number], 01-10-2001) on 06/22/23 at  7:45 PM EST by a video-enabled telemedicine application and verified that I am speaking with the correct person using two identifiers.  Location: Patient: Virtual Visit  Location Patient: Home Provider: Virtual Visit Location Provider: Home Office   I discussed the limitations of evaluation and management by telemedicine and the availability of in person appointments. The patient expressed understanding and agreed to proceed.    History of Present Illness: Jamie Moyer is a 22 y.o. who identifies as a female who was assigned female at birth, and is being seen today for pain of right side starting yesterday afternoon.  Heavy lifting at work but no known injury. Notes pain is a soreness and aching that will start in R pelvic/hip and radiate around to lower back. Denies radiation into buttock or down the extremity. Denies numbness, tingling or weakness. Today better but when laying on that side caused increased pain. Also with certain positions. She has history of UTI with low back pain so wanted to speak with someone. Denies denies urinary urgency, frequency, hesitancy, dysuria, hematuria, nausea, fever, chills.Denies vaginal pain, pressure. LMP in past week.    HPI: HPI  Problems:  Patient Active Problem List   Diagnosis Date Noted   Contraceptive management 09/02/2022   Encounter for annual physical exam 09/02/2022   Lightheadedness 07/01/2022   Generalized anxiety disorder 04/25/2019   Allergic rhinitis 01/26/2019   Sebaceous cyst of breast, right 09/02/2017   Breast mass, right 08/23/2017   Left leg pain 07/01/2016   Left leg numbness 07/01/2016   Migraine without aura and without status migrainosus, not intractable 11/17/2015   Anxiety 11/13/2015   IBS (irritable bowel syndrome) 11/13/2015  Chest pain 10/06/2015   GERD (gastroesophageal reflux disease) 10/06/2015   Panic attack 09/29/2015   Precordial catch syndrome 09/29/2015    Allergies:  Allergies  Allergen Reactions   Cephalexin Rash   Medications:  Current Outpatient Medications:    meloxicam (MOBIC) 15 MG tablet, Take 1 tablet (15 mg total) by mouth daily., Disp: 15 tablet, Rfl:  0   Ergocalciferol 10 MCG (400 UNIT) TABS, Take 20 mcg by mouth daily at 6 (six) AM., Disp: , Rfl:    ibuprofen (ADVIL) 400 MG tablet, Take 400 mg by mouth every 6 (six) hours as needed for fever, headache, mild pain, moderate pain or cramping., Disp: , Rfl:    valACYclovir (VALTREX) 1000 MG tablet, Take 1 tablet by mouth as directed., Disp: , Rfl:   Observations/Objective: Patient is well-developed, well-nourished in no acute distress.  Resting comfortably at home.  Head is normocephalic, atraumatic.  No labored breathing.  Speech is clear and coherent with logical content.  Patient is alert and oriented at baseline.   Assessment and Plan: 1. Pain of right side of body - meloxicam (MOBIC) 15 MG tablet; Take 1 tablet (15 mg total) by mouth daily.  Dispense: 15 tablet; Refill: 0  R side including hip, around to lower back. Occurring after heavy lifting and is positional in nature. Better today on light duty at work. No alarm signs or symptoms. No history of renal stone, hematuria or other urinary symptoms to suggest UTI or nephrolithiasis. Supportive measures and OTC medications reviewed. Light duty for rest of the week. Meloxicam per orders. Strict in person precautions reviewed with patient.   Follow Up Instructions: I discussed the assessment and treatment plan with the patient. The patient was provided an opportunity to ask questions and all were answered. The patient agreed with the plan and demonstrated an understanding of the instructions.  A copy of instructions were sent to the patient via MyChart unless otherwise noted below.   The patient was advised to call back or seek an in-person evaluation if the symptoms worsen or if the condition fails to improve as anticipated.    Jamie Climes, PA-C

## 2023-06-22 NOTE — Patient Instructions (Signed)
  Jamie Moyer, thank you for joining [name], PA-C for today's virtual visit.  While this provider is not your primary care provider (PCP), if your PCP is located in our provider database this encounter information will be shared with them immediately following your visit.   A [facility] MyChart account gives you access to today's visit and all your visits, tests, and labs performed at [facility] " click here if you don't have a [facility] MyChart account or go to [email or web address]  Consent: (Patient) Jamie Moyer provided verbal consent for this virtual visit at the beginning of the encounter.  Current Medications:  Current Outpatient Medications:    Ergocalciferol 10 MCG (400 UNIT) TABS, Take 20 mcg by mouth daily at 6 (six) AM., Disp: , Rfl:    ibuprofen (ADVIL) 400 MG tablet, Take 400 mg by mouth every 6 (six) hours as needed for fever, headache, mild pain, moderate pain or cramping., Disp: , Rfl:    valACYclovir (VALTREX) 1000 MG tablet, Take 1 tablet by mouth as directed., Disp: , Rfl:    Medications ordered in this encounter:  No orders of the defined types were placed in this encounter.   *If you need refills on other medications prior to your next appointment, please contact your pharmacy*  Follow-Up: Call back or seek an in-person evaluation if the symptoms worsen or if the condition fails to improve as anticipated.  [facility] [phone]  Other Instructions Avoid heavy lifting and overexertion. Apply heating pad to the area of concern for 10-15 minutes, a few times per day. Take the Meloxicam once daily with food. You can take OTC Tylenol along with this.  IF not resolving or any new/worsening symptoms you need an in-person evaluation.   If you have been instructed to have an in-person evaluation today at a local Urgent Care facility, please use the link below. It will take you to a list of all of our available  [facility], including address, phone number and hours of operation. Please do not delay care.  [facility]  If you or a family member do not have a primary care provider, use the link below to schedule a visit and establish care. When you choose a [facility] primary care physician or advanced practice provider, you gain a long-term partner in health. Find a Primary Care Provider  Learn more about [facility]'s in-office and virtual care options: [facility] - Get Care Now

## 2023-06-27 ENCOUNTER — Telehealth: Payer: BC Managed Care – PPO

## 2023-06-27 DIAGNOSIS — M545 Low back pain, unspecified: Secondary | ICD-10-CM

## 2023-06-27 DIAGNOSIS — R3989 Other symptoms and signs involving the genitourinary system: Secondary | ICD-10-CM | POA: Diagnosis not present

## 2023-06-27 MED ORDER — NITROFURANTOIN MONOHYD MACRO 100 MG PO CAPS: 100.0000 mg | ORAL_CAPSULE | Freq: Two times a day (BID) | ORAL | 0 refills | Status: AC

## 2023-06-27 NOTE — Patient Instructions (Signed)
Jamie Moyer, thank you for joining Margaretann Loveless, PA-C for today's virtual visit.  While this provider is not your primary care provider (PCP), if your PCP is located in our provider database this encounter information will be shared with them immediately following your visit.   A [facility] MyChart account gives you access to today's visit and all your visits, tests, and labs performed at [facility] " click here if you don't have a [facility] MyChart account or go to [email or web address]  Consent: (Patient) Jamie Moyer provided verbal consent for this virtual visit at the beginning of the encounter.  Current Medications:  Current Outpatient Medications:    nitrofurantoin, macrocrystal-monohydrate, (MACROBID) 100 MG capsule, Take 1 capsule (100 mg total) by mouth 2 (two) times daily., Disp: 10 capsule, Rfl: 0   Ergocalciferol 10 MCG (400 UNIT) TABS, Take 20 mcg by mouth daily at 6 (six) AM., Disp: , Rfl:    ibuprofen (ADVIL) 400 MG tablet, Take 400 mg by mouth every 6 (six) hours as needed for fever, headache, mild pain, moderate pain or cramping., Disp: , Rfl:    meloxicam (MOBIC) 15 MG tablet, Take 1 tablet (15 mg total) by mouth daily., Disp: 15 tablet, Rfl: 0   valACYclovir (VALTREX) 1000 MG tablet, Take 1 tablet by mouth as directed., Disp: , Rfl:    Medications ordered in this encounter:  Meds ordered this encounter  Medications   nitrofurantoin, macrocrystal-monohydrate, (MACROBID) 100 MG capsule    Sig: Take 1 capsule (100 mg total) by mouth 2 (two) times daily.    Dispense:  10 capsule    Refill:  0    Order Specific Question:   Supervising Provider    Answer:   Merrilee Jansky [id number]     *If you need refills on other medications prior to your next appointment, please contact your pharmacy*  Follow-Up: Call back or seek an in-person evaluation if the symptoms worsen or if the condition fails to improve as anticipated.  [facility] [phone]  Other Instructions  Back Exercises The following exercises strengthen the muscles that help to support the trunk (torso) and back. They also help to keep the lower back flexible. Doing these exercises can help to prevent or lessen existing low back pain. If you have back pain or discomfort, try doing these exercises 2-3 times each day or as told by your health care provider. As your pain improves, do them once each day, but increase the number of times that you repeat the steps for each exercise (do more repetitions). To prevent the recurrence of back pain, continue to do these exercises once each day or as told by your health care provider. Do exercises exactly as told by your health care provider and adjust them as directed. It is normal to feel mild stretching, pulling, tightness, or discomfort as you do these exercises, but you should stop right away if you feel sudden pain or your pain gets worse. Exercises Single knee to chest Repeat these steps 3-5 times for each leg: Lie on your back on a firm bed or the floor with your legs extended. Bring one knee to your chest. Your other leg should stay extended and in contact with the floor. Hold your knee in place by grabbing your knee or thigh with both hands and hold. Pull on your knee until you feel a gentle stretch in your lower back or buttocks. Hold the stretch for 10-30 seconds. Slowly release  and straighten your leg.  Pelvic tilt Repeat these steps 5-10 times: Lie on your back on a firm bed or the floor with your legs extended. Bend your knees so they are pointing toward the ceiling and your feet are flat on the floor. Tighten your lower abdominal muscles to press your lower back against the floor. This motion will tilt your pelvis so your tailbone points up toward the ceiling instead of pointing to your feet or the floor. With gentle tension and even breathing, hold this position for 5-10  seconds.  Cat-cow Repeat these steps until your lower back becomes more flexible: Get into a hands-and-knees position on a firm bed or the floor. Keep your hands under your shoulders, and keep your knees under your hips. You may place padding under your knees for comfort. Let your head hang down toward your chest. Contract your abdominal muscles and point your tailbone toward the floor so your lower back becomes rounded like the back of a cat. Hold this position for 5 seconds. Slowly lift your head, let your abdominal muscles relax, and point your tailbone up toward the ceiling so your back forms a sagging arch like the back of a cow. Hold this position for 5 seconds.  Press-ups Repeat these steps 5-10 times: Lie on your abdomen (face-down) on a firm bed or the floor. Place your palms near your head, about shoulder-width apart. Keeping your back as relaxed as possible and keeping your hips on the floor, slowly straighten your arms to raise the top half of your body and lift your shoulders. Do not use your back muscles to raise your upper torso. You may adjust the placement of your hands to make yourself more comfortable. Hold this position for 5 seconds while you keep your back relaxed. Slowly return to lying flat on the floor.  Bridges Repeat these steps 10 times: Lie on your back on a firm bed or the floor. Bend your knees so they are pointing toward the ceiling and your feet are flat on the floor. Your arms should be flat at your sides, next to your body. Tighten your buttocks muscles and lift your buttocks off the floor until your waist is at almost the same height as your knees. You should feel the muscles working in your buttocks and the back of your thighs. If you do not feel these muscles, slide your feet 1-2 inches (2.5-5 cm) farther away from your buttocks. Hold this position for 3-5 seconds. Slowly lower your hips to the starting position, and allow your buttocks muscles to relax  completely. If this exercise is too easy, try doing it with your arms crossed over your chest. Abdominal crunches Repeat these steps 5-10 times: Lie on your back on a firm bed or the floor with your legs extended. Bend your knees so they are pointing toward the ceiling and your feet are flat on the floor. Cross your arms over your chest. Tip your chin slightly toward your chest without bending your neck. Tighten your abdominal muscles and slowly raise your torso high enough to lift your shoulder blades a tiny bit off the floor. Avoid raising your torso higher than that because it can put too much stress on your lower back and does not help to strengthen your abdominal muscles. Slowly return to your starting position.  Back lifts Repeat these steps 5-10 times: Lie on your abdomen (face-down) with your arms at your sides, and rest your forehead on the floor. Tighten the muscles  in your legs and your buttocks. Slowly lift your chest off the floor while you keep your hips pressed to the floor. Keep the back of your head in line with the curve in your back. Your eyes should be looking at the floor. Hold this position for 3-5 seconds. Slowly return to your starting position.  Contact a health care provider if: Your back pain or discomfort gets much worse when you do an exercise. Your worsening back pain or discomfort does not lessen within 2 hours after you exercise. If you have any of these problems, stop doing these exercises right away. Do not do them again unless your health care provider says that you can. Get help right away if: You develop sudden, severe back pain. If this happens, stop doing the exercises right away. Do not do them again unless your health care provider says that you can. This information is not intended to replace advice given to you by your health care provider. Make sure you discuss any questions you have with your health care provider. Document Revised: 08/08/2022  Document Reviewed: 09/17/2020 Elsevier Patient Education  2024 [employer]vier Inc.    If you have been instructed to have an in-person evaluation today at a local Urgent Care facility, please use the link below. It will take you to a list of all of our available [facility], including address, phone number and hours of operation. Please do not delay care.  [facility]  If you or a family member do not have a primary care provider, use the link below to schedule a visit and establish care. When you choose a [facility] primary care physician or advanced practice provider, you gain a long-term partner in health. Find a Primary Care Provider  Learn more about [facility]'s in-office and virtual care options: [facility] - Get Care Now

## 2023-06-27 NOTE — Progress Notes (Signed)
Virtual Visit Consent   Jamie Moyer, you are scheduled for a virtual visit with a [facility] provider today. Just as with appointments in the office, your consent must be obtained to participate. Your consent will be active for this visit and any virtual visit you may have with one of our providers in the next 365 days. If you have a MyChart account, a copy of this consent can be sent to you electronically.  As this is a virtual visit, video technology does not allow for your provider to perform a traditional examination. This may limit your provider's ability to fully assess your condition. If your provider identifies any concerns that need to be evaluated in person or the need to arrange testing (such as labs, EKG, etc.), we will make arrangements to do so. Although advances in technology are sophisticated, we cannot ensure that it will always work on either your end or our end. If the connection with a video visit is poor, the visit may have to be switched to a telephone visit. With either a video or telephone visit, we are not always able to ensure that we have a secure connection.  By engaging in this virtual visit, you consent to the provision of healthcare and authorize for your insurance to be billed (if applicable) for the services provided during this visit. Depending on your insurance coverage, you may receive a charge related to this service.  I need to obtain your verbal consent now. Are you willing to proceed with your visit today? [name] has provided verbal consent on 06/27/2023 for a virtual visit (video or telephone). Margaretann Loveless, PA-C  Date: 06/27/2023 8:24 AM  Virtual Visit via Video Note   I, Margaretann Loveless, connected with  Jamie Moyer  ([id number], [birth year]) on 06/27/23 at  8:00 AM EST by a video-enabled telemedicine application and verified that I am speaking with the correct person using two identifiers.  Location: Patient: Virtual Visit  Location Patient: Home Provider: Virtual Visit Location Provider: Home Office   I discussed the limitations of evaluation and management by telemedicine and the availability of in person appointments. The patient expressed understanding and agreed to proceed.    History of Present Illness: Jamie Moyer is a 22 y.o. who identifies as a female who was assigned female at birth, and is being seen today for continued back pain. Patient works for [employer] and was seen on 06/22/23 for same issue. Pain is mostly on right lower back and radiates to the right hip. Denies numbness, tingling, or weakness. Was prescribed Meloxicam 15mg , which she has been taking daily. Reports no change in symptoms.   Does have a history of UTIs and she questions if this may be an atypical UTI. She denies urinary changes and dysuria. Denies vaginal discharge. States she was told if pain did not improve that she could reach back out to try UTI medication to see if that may help.   Problems:  Patient Active Problem List   Diagnosis Date Noted   Contraceptive management 09/02/2022   Encounter for annual physical exam 09/02/2022   Lightheadedness 07/01/2022   Generalized anxiety disorder 04/25/2019   Allergic rhinitis 01/26/2019   Sebaceous cyst of breast, right 09/02/2017   Breast mass, right 08/23/2017   Left leg pain 07/01/2016   Left leg numbness 07/01/2016   Migraine without aura and without status migrainosus, not intractable 11/17/2015   Anxiety 11/13/2015   IBS (irritable bowel syndrome) 11/13/2015  Chest pain 10/06/2015   GERD (gastroesophageal reflux disease) 10/06/2015   Panic attack 09/29/2015   Precordial catch syndrome 09/29/2015    Allergies:  Allergies  Allergen Reactions   Cephalexin Rash   Medications:  Current Outpatient Medications:    nitrofurantoin, macrocrystal-monohydrate, (MACROBID) 100 MG capsule, Take 1 capsule (100 mg total) by mouth 2 (two) times daily., Disp: 10 capsule, Rfl: 0    Ergocalciferol 10 MCG (400 UNIT) TABS, Take 20 mcg by mouth daily at 6 (six) AM., Disp: , Rfl:    ibuprofen (ADVIL) 400 MG tablet, Take 400 mg by mouth every 6 (six) hours as needed for fever, headache, mild pain, moderate pain or cramping., Disp: , Rfl:    meloxicam (MOBIC) 15 MG tablet, Take 1 tablet (15 mg total) by mouth daily., Disp: 15 tablet, Rfl: 0   valACYclovir (VALTREX) 1000 MG tablet, Take 1 tablet by mouth as directed., Disp: , Rfl:   Observations/Objective: Patient is well-developed, well-nourished in no acute distress.  Resting comfortably at home.  Head is normocephalic, atraumatic.  No labored breathing.  Speech is clear and coherent with logical content.  Patient is alert and oriented at baseline.    Assessment and Plan: 1. Suspected UTI - nitrofurantoin, macrocrystal-monohydrate, (MACROBID) 100 MG capsule; Take 1 capsule (100 mg total) by mouth 2 (two) times daily.  Dispense: 10 capsule; Refill: 0  2. Acute right-sided low back pain without sciatica  - Symptoms not improving with Meloxicam - Will treat empirically with Macrobid for possible UTI based off patient's concerns and history - Continue Meloxicam for now - Continue to push fluids.  - Seek in person evaluation for urine culture if symptoms do not improve by Wednesday, 06/29/23, or if they worsen at all   Follow Up Instructions: I discussed the assessment and treatment plan with the patient. The patient was provided an opportunity to ask questions and all were answered. The patient agreed with the plan and demonstrated an understanding of the instructions.  A copy of instructions were sent to the patient via MyChart unless otherwise noted below.    The patient was advised to call back or seek an in-person evaluation if the symptoms worsen or if the condition fails to improve as anticipated.    Margaretann Loveless, PA-C

## 2023-07-06 ENCOUNTER — Telehealth: Payer: BC Managed Care – PPO

## 2023-07-06 DIAGNOSIS — R3989 Other symptoms and signs involving the genitourinary system: Secondary | ICD-10-CM

## 2023-07-06 DIAGNOSIS — M545 Low back pain, unspecified: Secondary | ICD-10-CM

## 2023-07-06 NOTE — Progress Notes (Signed)
Reports she is not feeling better after using Meloxicam for back pain and taking Macrobid for UTI. Advised that at this time, she will need in person at her PCP and or local Urgent Care office to get a urine sample, and back checked-for causes of on going pain.  Of note was seen by our group on 12/4 and 12/9, advised at the last visit to be seen in person by 12/11 if not feeling better.  Patient acknowledged agreement and understanding of the plan.

## 2023-07-07 ENCOUNTER — Encounter (HOSPITAL_COMMUNITY): Payer: Self-pay | Admitting: Family Medicine

## 2023-07-07 ENCOUNTER — Ambulatory Visit (HOSPITAL_COMMUNITY)
Admission: EM | Admit: 2023-07-07 | Discharge: 2023-07-07 | Disposition: A | Discharge status: Home / Self Care | Payer: BC Managed Care – PPO | Attending: Family Medicine | Admitting: Family Medicine

## 2023-07-07 DIAGNOSIS — S39012A Strain of muscle, fascia and tendon of lower back, initial encounter: Secondary | ICD-10-CM | POA: Diagnosis not present

## 2023-07-07 DIAGNOSIS — R030 Elevated blood-pressure reading, without diagnosis of hypertension: Secondary | ICD-10-CM

## 2023-07-07 LAB — POCT URINALYSIS DIP (MANUAL ENTRY)
Bilirubin, UA: NEGATIVE
Blood, UA: NEGATIVE
Glucose, UA: NEGATIVE mg/dL
Leukocytes, UA: NEGATIVE
Nitrite, UA: NEGATIVE
Protein Ur, POC: NEGATIVE mg/dL
Spec Grav, UA: 1.025 (ref 1.010–1.025)
Urobilinogen, UA: 0.2 U/dL
pH, UA: 6 (ref 5.0–8.0)

## 2023-07-07 LAB — POCT URINE PREGNANCY: Preg Test, Ur: NEGATIVE

## 2023-07-07 NOTE — ED Triage Notes (Signed)
Patient here today with c/o LB pain and sometimes her lower abd hurts X 1-2 weeks. She has been taking Nitrofurantoin. Patient states that she did a video visit and was prescribed this. This helped for a few days but symptoms returned.

## 2023-07-07 NOTE — ED Provider Notes (Signed)
[facility]    CSN: [id number] Arrival date & time: 07/07/23  0805      History   Chief Complaint Chief Complaint  Patient presents with   Abdominal Pain    HPI Jamie Moyer is a 22 y.o. female.   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 She has been drinking 2 bottles of water daily and has been told by her PCP that she needs to increase her protein and water intake and decrease junk food. She denies ABD pain, changes in bowel or bladder, distal numbness/weakness, or unintentional weight loss. She has always been underweight and mother/grandmother have hx of diabetes diagnosed later in life. She denies increased thirst or night time urination, vaginal discharge or blood in the urine. Today her pain is minimal.   The history is provided by the patient.    Past Medical History:  Diagnosis Date   Anxiety    Depression    Gestational hypertension, third trimester 08/30/2019   [ ]  Aspirin 81 mg daily after 12 weeks  Current antihypertensives:  None      Baseline and surveillance labs (pulled in from [facility], refresh links as needed)       Lab Results  Component  Value  Date     PLT  237  07/25/2019     CREATININE  0.47 (L)  07/25/2019     AST  11  07/25/2019     ALT  8  07/25/2019        Antenatal Testing  CHTN - O10.919   Group I   BP < 140/90, no meds, no preeclampsia, A   Marginal insertion of umbilical cord affecting management of mother in third trimester 09/04/2019   Mononucleosis    Precordial catch syndrome    Spleen enlargement    Supervision of normal first pregnancy, antepartum 03/20/2019    Nursing Staff Provider  Office Location  [facility] Dating  LMP 12/30/2018  Language  English Anatomy US  Normal, but limited d/t fetal position. F/U Scheduled  Flu Vaccine   [ ]  Received    Arly.Keller ] Declined Genetic Screen  NIPS:Low risk girl   AFP:   First Screen:  Quad:    TDaP vaccine    [ ]  Received    Arly.Keller ] Declined Hgb A1C  or  GTT Early  Third trimester   Rhogam  N/A   LAB RESULTS   Feeding Pla    Patient Active Problem List   Diagnosis Date Noted   Contraceptive management 09/02/2022   Encounter for annual physical exam 09/02/2022   Lightheadedness 07/01/2022   Generalized anxiety disorder 04/25/2019   Allergic rhinitis 01/26/2019   Sebaceous cyst of breast, right 09/02/2017   Breast mass, right 08/23/2017   Left leg pain 07/01/2016   Left leg numbness 07/01/2016   Migraine without aura and without status migrainosus, not intractable 11/17/2015   Anxiety 11/13/2015   IBS (irritable bowel syndrome) 11/13/2015   Chest pain 10/06/2015   GERD (gastroesophageal reflux disease) 10/06/2015   Panic attack 09/29/2015   Precordial catch syndrome 09/29/2015    Past Surgical History:  Procedure Laterality Date   CESAREAN SECTION N/A 09/05/2019   Procedure: CESAREAN SECTION;  Surgeon: Allie Bossier, MD;  Location: [facility];  Service: Obstetrics;  Laterality: N/A;   LAPAROSCOPIC APPENDECTOMY N/A 12/24/2019   Procedure: APPENDECTOMY LAPAROSCOPIC;  Surgeon: Axel Filler, MD;  Location: [facility] OR;  Service:  General;  Laterality: N/A;   NO PAST SURGERIES      OB History     Gravida  1   Para  1   Term      Preterm  1   AB      Living  1      SAB      IAB      Ectopic      Multiple  0   Live Births  1            Home Medications    Prior to Admission medications   Medication Sig Start Date End Date Taking? Authorizing Provider  ibuprofen (ADVIL) 400 MG tablet Take 400 mg by mouth every 6 (six) hours as needed for fever, headache, mild pain, moderate pain or cramping. 07/09/16   [provider]  meloxicam (MOBIC) 15 MG tablet Take 1 tablet (15 mg total) by mouth daily. 06/22/23   Waldon Merl, PA-C  nitrofurantoin, macrocrystal-monohydrate, (MACROBID) 100 MG capsule Take 1 capsule (100 mg total) by mouth 2 (two) times daily. 06/27/23   Margaretann Loveless, PA-C   valACYclovir (VALTREX) 1000 MG tablet Take 1 tablet by mouth as directed. 04/23/20   [provider]    Family History Family History  Problem Relation Age of Onset   Migraines Mother    Panic disorder Mother    High blood pressure Mother    Heart failure Mother    Healthy Father    Kidney disease Maternal Grandmother    Diabetes Other    Heart disease Other     Social History Social History   Tobacco Use   Smoking status: Never    Passive exposure: Never   Smokeless tobacco: Never  Vaping Use   Vaping status: Former  Substance Use Topics   Alcohol use: Yes    Comment: Rare   Drug use: Yes    Frequency: 1.0 times per week    Types: Marijuana     Allergies   Cephalexin   Review of Systems Review of Systems  Constitutional:  Negative for activity change, appetite change, fatigue and fever.  Cardiovascular:  Negative for chest pain.  Gastrointestinal:  Negative for abdominal pain, blood in stool, constipation, diarrhea, nausea and vomiting.  Endocrine: Negative for cold intolerance, heat intolerance, polydipsia, polyphagia and polyuria.  Genitourinary:  Negative for dysuria, hematuria and vaginal discharge.  Musculoskeletal:  Positive for back pain (minimal today but increased after working). Negative for arthralgias, gait problem and joint swelling.  Skin:  Negative for rash.  Neurological:  Positive for light-headedness (occasional and worked up previously by PCP). Negative for dizziness, syncope, weakness and numbness.     Physical Exam Triage Vital Signs ED Triage Vitals  Encounter Vitals Group     BP      Systolic BP Percentile      Diastolic BP Percentile      Pulse      Resp      Temp      Temp src      SpO2      Weight      Height      Head Circumference      Peak Flow      Pain Score      Pain Loc      Pain Education      Exclude from Growth Chart    No data found.  Updated Vital Signs BP (!) 145/80 (BP Location: Left Arm)  Pulse (!) 120   Temp 98.1 F (36.7 C) (Oral)   Resp 16   Ht 4' 9.5" (1.461 m)   Wt 37.6 kg   LMP 06/17/2023 (Approximate)   SpO2 99%   BMI 17.65 kg/m   Visual Acuity Right Eye Distance:   Left Eye Distance:   Bilateral Distance:    Right Eye Near:   Left Eye Near:    Bilateral Near:     Physical Exam Vitals reviewed.  Constitutional:      Appearance: She is well-developed.     Comments: Thin build   HENT:     Head: Normocephalic and atraumatic.  Cardiovascular:     Rate and Rhythm: Normal rate and regular rhythm.     Heart sounds: Normal heart sounds. No murmur heard. Pulmonary:     Effort: Pulmonary effort is normal.     Breath sounds: No wheezing or rales.  Abdominal:     General: Abdomen is flat. Bowel sounds are normal. There is no distension.     Palpations: Abdomen is soft.     Tenderness: There is no abdominal tenderness. There is no right CVA tenderness, left CVA tenderness, guarding or rebound.  Musculoskeletal:        General: No signs of injury.     Right lower leg: No edema.     Left lower leg: No edema.     Comments: Back Exam:  Inspection: Unremarkable, no scoliosis Motion: Flexion 45 deg, Extension 45 deg, Side Bending to 45 deg bilaterally, Rotation to 45 deg bilaterally, nothing elicits pain SLR laying: Negative, but marked hamstring tightness bilaterally XSLR laying: Negative  Palpable tenderness: none, some mild ropy texture of the latissimus and some weakness of the lower rhomboids Sensory change: Gross sensation intact to all lumbar and sacral dermatomes.  Reflexes: 2+ at both patellar tendons, 2+ at achilles tendons, Babinski's downgoing.  Strength at foot: Plantar-flexion: 5/5 Dorsi-flexion: 5/5 Eversion: 5/5 Inversion: 5/5  Leg strength  Quad: 5/5 Hamstring: 5/5 Hip flexor: 5/5 Hip abductors: 5/5  Gait unremarkable.    Skin:    General: Skin is warm.     Capillary Refill: Capillary refill takes more than 3 seconds.  Neurological:      General: No focal deficit present.     Mental Status: She is alert.     Sensory: No sensory deficit.  Psychiatric:        Mood and Affect: Mood normal.        Behavior: Behavior normal.      UC Treatments / Results  Labs (all labs ordered are listed, but only abnormal results are displayed) Labs Reviewed  POCT URINALYSIS DIP (MANUAL ENTRY) - Abnormal; Notable for the following components:      Result Value   Ketones, POC UA large (80) (*)    All other components within normal limits  POCT URINE PREGNANCY    EKG   Radiology No results found.  Procedures Procedures (including critical care time)  Medications Ordered in UC Medications - No data to display  Initial Impression / Assessment and Plan / UC Course  I have reviewed the triage vital signs and the nursing notes.  Pertinent labs & imaging results that were available during my care of the patient were reviewed by me and considered in my medical decision making (see chart for details).     Muscular lumbothoracic back pain - The patient has a physically demanding job and a low body weight. She has not been drinking enough  water nor having proper nutritional intake for her physical activity due to habits. We discussed her low BMI and metabolic imbalance and the possible complications due to this.  - No CVA tenderness and UA w/ ketones but pt is chronically dehydrated - Ucg negative - There is some weakness of the rhomboids and scapular angle winging worse on the right which may be causing pain due to muscular imbalance but no neurologic sxs, swelling, or concern for fracture - We discussed core strengthening, dynamic activation prior to work, and the use of OTC lidocaine patches - The pt declines medication today.  - I recommended follow up on her high blood pressure reading today. She reports being nervous but will want to monitor it.  - I also believe she would benefit from a nutritional consultation to correct her  metabolic deficiency and screen for any psychological reasons for low BMI.  - The pt voiced understanding and agreement with the plan.    Final Clinical Impressions(s) / UC Diagnoses   Final diagnoses:  Back strain, initial encounter  Elevated blood pressure, situational     Discharge Instructions      You have had a muscular strain of your thoracic back muscles. Make sure you are doing some range of motion exercises and core strengthening. You need to increase your oral hydration because of chronic dehydration. Monitor your urine.  It should be a very light yellow color but not completely clear.  If it is darker yellow increase your hydration. Because you have a low body weight we need to increase your intake of protein, healthy carbohydrates, and fats Follow-up with your primary care provider to follow-up on your elevated blood pressure reading.  This may be from stress but you do not want to miss an underlying issue with high blood pressure.   I believe you will also benefit from a visit with a nutritionist as there appears to be an energy imbalance with the high workload you are performing.     ED Prescriptions   None    PDMP not reviewed this encounter.   Ivor Messier, MD 07/07/23 [phone]

## 2023-07-07 NOTE — Discharge Instructions (Addendum)
You have had a muscular strain of your thoracic back muscles. Make sure you are doing some range of motion exercises and core strengthening. You need to increase your oral hydration because of chronic dehydration. Monitor your urine.  It should be a very light yellow color but not completely clear.  If it is darker yellow increase your hydration. Because you have a low body weight we need to increase your intake of protein, healthy carbohydrates, and fats Follow-up with your primary care provider to follow-up on your elevated blood pressure reading.  This may be from stress but you do not want to miss an underlying issue with high blood pressure.   I believe you will also benefit from a visit with a nutritionist as there appears to be an energy imbalance with the high workload you are performing.

## 2023-07-11 ENCOUNTER — Encounter: Payer: Self-pay | Admitting: Student

## 2023-07-11 ENCOUNTER — Ambulatory Visit (INDEPENDENT_AMBULATORY_CARE_PROVIDER_SITE_OTHER): Payer: BC Managed Care – PPO | Admitting: Student

## 2023-07-11 VITALS — BP 129/111 | HR 93 | Ht <= 58 in | Wt 80.8 lb

## 2023-07-11 DIAGNOSIS — R739 Hyperglycemia, unspecified: Secondary | ICD-10-CM | POA: Diagnosis not present

## 2023-07-11 LAB — POCT GLYCOSYLATED HEMOGLOBIN (HGB A1C): Hemoglobin A1C: 5.4 % (ref 4.0–5.6)

## 2023-07-11 NOTE — Progress Notes (Signed)
    SUBJECTIVE:   CHIEF COMPLAINT / HPI:   22 year old female with history of anxiety presenting today for concerns of diabetes.  Recently seen at the urgent care where she was informed that she had elevated ketones in the urine.  Patient's overall has denied any concerns of polyuria, polydipsia, nausea or vomiting.  Does have family history of diabetes in mother and maternal grandmother.  PERTINENT  PMH / PSH: Reviewed  OBJECTIVE:   BP (!) 122/101   Pulse 93   Ht 4' 9.5" (1.461 m)   Wt 80 lb 12.8 oz (36.7 kg)   LMP 06/17/2023 (Approximate)   SpO2 100%   BMI 17.18 kg/m   Physical Exam General: Anxious, well appearing, NAD Cardiovascular: RRR, No Murmurs, Normal S2/S2 Respiratory: CTAB, No wheezing or Rales Abdomen: No distension or tenderness Extremities: No edema on extremities     ASSESSMENT/PLAN:   Elevated Ketones Patient consigned about recent UA that showed elevated ketones in which the provider at urgent care informed her it could be sign of diabetes.  Patient will like to be tested for diabetes today.  A1c obtained was 5.4%.  Righted reassurance to patient and discussed exercising and dieting to prevent diabetes in future.  Suspect elevated ketones could be secondary to dehydration given patient recent illness.   Jerre Simon, MD [facility]

## 2023-07-11 NOTE — Patient Instructions (Signed)
Pleasure to meet you today.  Your A1c today was 5.4%.  No signs of diabetes at this time.  Suspect your elevated ketones could be due to your ongoing illness at the time and simple dehydration.  Please make sure you are well-hydrated.  To reduce your risk of diabetes in the future it is imperative to continue exercising and indulge in appropriate diet which includes low fat diet, low fried food, and reduced soda/juice.

## 2023-09-14 ENCOUNTER — Telehealth: Payer: BC Managed Care – PPO

## 2023-12-27 ENCOUNTER — Encounter: Payer: Self-pay | Admitting: *Deleted

## 2023-12-28 ENCOUNTER — Telehealth

## 2024-01-10 ENCOUNTER — Telehealth: Admitting: Family Medicine

## 2024-01-10 DIAGNOSIS — B3731 Acute candidiasis of vulva and vagina: Secondary | ICD-10-CM

## 2024-01-10 MED ORDER — FLUCONAZOLE 150 MG PO TABS: 150.0000 mg | ORAL_TABLET | Freq: Every day | ORAL | 0 refills | Status: DC

## 2024-01-10 NOTE — Progress Notes (Signed)
 Virtual Visit Consent   Jamie Moyer, you are scheduled for a virtual visit with a [facility] provider today. Just as with appointments in the office, your consent must be obtained to participate. Your consent will be active for this visit and any virtual visit you may have with one of our providers in the next 365 days. If you have a MyChart account, a copy of this consent can be sent to you electronically.  As this is a virtual visit, video technology does not allow for your provider to perform a traditional examination. This may limit your provider's ability to fully assess your condition. If your provider identifies any concerns that need to be evaluated in person or the need to arrange testing (such as labs, EKG, etc.), we will make arrangements to do so. Although advances in technology are sophisticated, we cannot ensure that it will always work on either your end or our end. If the connection with a video visit is poor, the visit may have to be switched to a telephone visit. With either a video or telephone visit, we are not always able to ensure that we have a secure connection.  By engaging in this virtual visit, you consent to the provision of healthcare and authorize for your insurance to be billed (if applicable) for the services provided during this visit. Depending on your insurance coverage, you may receive a charge related to this service.  I need to obtain your verbal consent now. Are you willing to proceed with your visit today? Jamie Moyer has provided verbal consent on 01/10/2024 for a virtual visit (video or telephone). Jamie CHRISTELLA Barefoot, NP  Date: 01/10/2024 1:50 PM   Virtual Visit via Video Note   I, Jamie Moyer, connected with  Jamie Moyer  ([id number], Mar 06, 2001) on 01/10/24 at  1:45 PM EDT by a video-enabled telemedicine application and verified that I am speaking with the correct person using two identifiers.  Location: Patient: Virtual Visit Location  Patient: Home Provider: Virtual Visit Location Provider: Home Office   I discussed the limitations of evaluation and management by telemedicine and the availability of in person appointments. The patient expressed understanding and agreed to proceed.    History of Present Illness: Jamie Moyer is a 23 y.o. who identifies as a female who was assigned female at birth, and is being seen today for itching  Onset two days ago with itching and dryness. No discharge- started using new pads with this last cycle. Denies new sexual partner, STI, pelvic-back pain, fevers, chills, no UTI symptoms  Problems:  Patient Active Problem List   Diagnosis Date Noted   Contraceptive management 09/02/2022   Encounter for annual physical exam 09/02/2022   Lightheadedness 07/01/2022   Generalized anxiety disorder 04/25/2019   Allergic rhinitis 01/26/2019   Sebaceous cyst of breast, right 09/02/2017   Breast mass, right 08/23/2017   Left leg pain 07/01/2016   Left leg numbness 07/01/2016   Migraine without aura and without status migrainosus, not intractable 11/17/2015   Anxiety 11/13/2015   IBS (irritable bowel syndrome) 11/13/2015   Chest pain 10/06/2015   GERD (gastroesophageal reflux disease) 10/06/2015   Panic attack 09/29/2015   Precordial catch syndrome 09/29/2015    Allergies:  Allergies  Allergen Reactions   Cephalexin Rash   Medications:  Current Outpatient Medications:    ibuprofen  (ADVIL ) 400 MG tablet, Take 400 mg by mouth every 6 (six) hours as needed for fever, headache, mild pain, moderate pain or  cramping., Disp: , Rfl:    meloxicam  (MOBIC ) 15 MG tablet, Take 1 tablet (15 mg total) by mouth daily., Disp: 15 tablet, Rfl: 0   nitrofurantoin , macrocrystal-monohydrate, (MACROBID ) 100 MG capsule, Take 1 capsule (100 mg total) by mouth 2 (two) times daily., Disp: 10 capsule, Rfl: 0   valACYclovir (VALTREX) 1000 MG tablet, Take 1 tablet by mouth as directed., Disp: , Rfl:    Observations/Objective: Patient is well-developed, well-nourished in no acute distress.  Resting comfortably  at home.  Head is normocephalic, atraumatic.  No labored breathing.  Speech is clear and coherent with logical content.  Patient is alert and oriented at baseline.    Assessment and Plan:  1. Yeast vaginitis (Primary)  - fluconazole  (DIFLUCAN ) 150 MG tablet; Take 1 tablet (150 mg total) by mouth daily.  Dispense: 1 tablet; Refill: 0  -treatment and prevention discussed on AVS as well -follow up if in improving   Reviewed side effects, risks and benefits of medication.    Patient acknowledged agreement and understanding of the plan.   Past Medical, Surgical, Social History, Allergies, and Medications have been Reviewed.    Follow Up Instructions: I discussed the assessment and treatment plan with the patient. The patient was provided an opportunity to ask questions and all were answered. The patient agreed with the plan and demonstrated an understanding of the instructions.  A copy of instructions were sent to the patient via MyChart unless otherwise noted below.    The patient was advised to call back or seek an in-person evaluation if the symptoms worsen or if the condition fails to improve as anticipated.    Jamie CHRISTELLA Barefoot, NP

## 2024-01-10 NOTE — Patient Instructions (Signed)
 Jamie Moyer, thank you for joining [name], NP for today's virtual visit.  While this provider is not your primary care provider (PCP), if your PCP is located in our provider database this encounter information will be shared with them immediately following your visit.   A [facility]  MyChart account gives you access to today's visit and all your visits, tests, and labs performed at [facility]  click here if you don't have a [facility] MyChart account or go to [email or web address]  Consent: (Patient) Jamie Moyer provided verbal consent for this virtual visit at the beginning of the encounter.  Current Medications:  Current Outpatient Medications:    fluconazole  (DIFLUCAN ) 150 MG tablet, Take 1 tablet (150 mg total) by mouth daily., Disp: 1 tablet, Rfl: 0   ibuprofen  (ADVIL ) 400 MG tablet, Take 400 mg by mouth every 6 (six) hours as needed for fever, headache, mild pain, moderate pain or cramping., Disp: , Rfl:    meloxicam  (MOBIC ) 15 MG tablet, Take 1 tablet (15 mg total) by mouth daily., Disp: 15 tablet, Rfl: 0   nitrofurantoin , macrocrystal-monohydrate, (MACROBID ) 100 MG capsule, Take 1 capsule (100 mg total) by mouth 2 (two) times daily., Disp: 10 capsule, Rfl: 0   valACYclovir (VALTREX) 1000 MG tablet, Take 1 tablet by mouth as directed., Disp: , Rfl:    Medications ordered in this encounter:  Meds ordered this encounter  Medications   fluconazole  (DIFLUCAN ) 150 MG tablet    Sig: Take 1 tablet (150 mg total) by mouth daily.    Dispense:  1 tablet    Refill:  0    Supervising Provider:   LAMPTEY, PHILIP O [[id number]]     *If you need refills on other medications prior to your next appointment, please contact your pharmacy*  Follow-Up: Call back or seek an in-person evaluation if the symptoms worsen or if the condition fails to improve as anticipated.  [facility] [phone]  Other Instructions  Healthy vaginal hygiene  practices   -  Avoid sleeper pajamas. Nightgowns allow air to circulate.  Sleep without underpants whenever possible.  -  Wear cotton underpants during the day. Double-rinse underwear after washing to avoid residual irritants. Do not use fabric softeners for underwear and swimsuits.  - Avoid tights, leotards, leggings, skinny jeans, and other tight-fitting clothing. Skirts and loose-fitting pants allow air to circulate.  - Avoid pantyliners.  Instead use tampons or cotton pads.  - Use the restroom after intercourse to help prevent UTI's  - Daily warm bathing is helpful:     - Soak in clean water (no soap) for 10 to 15 minutes. Adding vinegar or baking soda to the water has not been specifically studied and may not be better than clean water alone.      - Use soap to wash regions other than the genital area just before getting out of the tub. Limit use of any soap on genital areas. Use fragance-free soaps.     - Rinse the genital area well and gently pat dry.  Don't rub.  Hair dryer to assist with drying can be used only if on cool setting.     - Do not use bubble baths or perfumed soaps.  - Do not use any feminine sprays, douches or powders.  These contain chemicals that will irritate the skin.  - If the genital area is tender or swollen, cool compresses may relieve the discomfort. Unscented wet wipes can be used instead of  toilet paper for wiping.   - Emollients, such as Vaseline, may help protect skin and can be applied to the irritated area.  - Always remember to wipe front-to-back after bowel movements. Pat dry after urination.  - Do not sit in wet swimsuits for long periods of time after swimming    If you have been instructed to have an in-person evaluation today at a local Urgent Care facility, please use the link below. It will take you to a list of all of our available [facility], including address, phone number and hours of operation. Please do not delay care.   [facility]  If you or a family member do not have a primary care provider, use the link below to schedule a visit and establish care. When you choose a [facility] primary care physician or advanced practice provider, you gain a long-term partner in health. Find a Primary Care Provider  Learn more about [facility]'s in-office and virtual care options: [facility] - Get Care Now

## 2024-04-15 ENCOUNTER — Telehealth

## 2024-04-15 ENCOUNTER — Telehealth: Admitting: Family

## 2024-04-15 DIAGNOSIS — B3731 Acute candidiasis of vulva and vagina: Secondary | ICD-10-CM | POA: Diagnosis not present

## 2024-04-15 MED ORDER — FLUCONAZOLE 150 MG PO TABS: 150.0000 mg | ORAL_TABLET | ORAL | 0 refills | Status: AC | PRN

## 2024-04-15 NOTE — Progress Notes (Signed)
 Virtual Visit Consent   Jamie Moyer, you are scheduled for a virtual visit with a [facility] provider today. Just as with appointments in the office, your consent must be obtained to participate. Your consent will be active for this visit and any virtual visit you may have with one of our providers in the next 365 days. If you have a MyChart account, a copy of this consent can be sent to you electronically.  As this is a virtual visit, video technology does not allow for your provider to perform a traditional examination. This may limit your provider's ability to fully assess your condition. If your provider identifies any concerns that need to be evaluated in person or the need to arrange testing (such as labs, EKG, etc.), we will make arrangements to do so. Although advances in technology are sophisticated, we cannot ensure that it will always work on either your end or our end. If the connection with a video visit is poor, the visit may have to be switched to a telephone visit. With either a video or telephone visit, we are not always able to ensure that we have a secure connection.  By engaging in this virtual visit, you consent to the provision of healthcare and authorize for your insurance to be billed (if applicable) for the services provided during this visit. Depending on your insurance coverage, you may receive a charge related to this service.  I need to obtain your verbal consent now. Are you willing to proceed with your visit today? AAIRAH NEGRETTE has provided verbal consent on 04/15/2024 for a virtual visit (video or telephone). Bari Learn, FNP  Date: 04/15/2024 4:17 PM   Virtual Visit via Video Note   I, Bari Learn, connected with  Jamie Moyer  ([id number], 10-09-2000) on 04/15/24 at  4:15 PM EDT by a video-enabled telemedicine application and verified that I am speaking with the correct person using two identifiers.  Location: Patient: Virtual Visit Location Patient:  Home Provider: Virtual Visit Location Provider: Home Office   I discussed the limitations of evaluation and management by telemedicine and the availability of in person appointments. The patient expressed understanding and agreed to proceed.    History of Present Illness: Jamie Moyer is a 23 y.o. who identifies as a female who was assigned female at birth, and is being seen today for vaginal discharge that yesterday after using a new soap.  HPI: Vaginal Discharge The patient's primary symptoms include genital itching and vaginal discharge. The patient's pertinent negatives include no genital odor. This is a new problem. The current episode started in the past 7 days. The problem occurs intermittently. The problem has been waxing and waning. The vaginal discharge was white and thick. She has tried nothing for the symptoms. The treatment provided no relief.    Problems:  Patient Active Problem List   Diagnosis Date Noted   Contraceptive management 09/02/2022   Encounter for annual physical exam 09/02/2022   Lightheadedness 07/01/2022   Generalized anxiety disorder 04/25/2019   Allergic rhinitis 01/26/2019   Sebaceous cyst of breast, right 09/02/2017   Breast mass, right 08/23/2017   Left leg pain 07/01/2016   Left leg numbness 07/01/2016   Migraine without aura and without status migrainosus, not intractable 11/17/2015   Anxiety 11/13/2015   IBS (irritable bowel syndrome) 11/13/2015   Chest pain 10/06/2015   GERD (gastroesophageal reflux disease) 10/06/2015   Panic attack 09/29/2015   Precordial catch syndrome 09/29/2015    Allergies:  Allergies  Allergen Reactions   Cephalexin Rash   Medications:  Current Outpatient Medications:    fluconazole  (DIFLUCAN ) 150 MG tablet, Take 1 tablet (150 mg total) by mouth every three (3) days as needed., Disp: 2 tablet, Rfl: 0   ibuprofen  (ADVIL ) 400 MG tablet, Take 400 mg by mouth every 6 (six) hours as needed for fever, headache, mild  pain, moderate pain or cramping., Disp: , Rfl:    meloxicam  (MOBIC ) 15 MG tablet, Take 1 tablet (15 mg total) by mouth daily., Disp: 15 tablet, Rfl: 0   nitrofurantoin , macrocrystal-monohydrate, (MACROBID ) 100 MG capsule, Take 1 capsule (100 mg total) by mouth 2 (two) times daily., Disp: 10 capsule, Rfl: 0   valACYclovir (VALTREX) 1000 MG tablet, Take 1 tablet by mouth as directed., Disp: , Rfl:   Observations/Objective: Patient is well-developed, well-nourished in no acute distress.  Resting comfortably  at home.  Head is normocephalic, atraumatic.  No labored breathing.  Speech is clear and coherent with logical content.  Patient is alert and oriented at baseline.    Assessment and Plan: 1. Vagina, candidiasis (Primary) - fluconazole  (DIFLUCAN ) 150 MG tablet; Take 1 tablet (150 mg total) by mouth every three (3) days as needed.  Dispense: 2 tablet; Refill: 0  Keep clean and dry Avoid scratching Start diflucan   Probiotic  Follow up if symptoms worsen or do not improve   Follow Up Instructions: I discussed the assessment and treatment plan with the patient. The patient was provided an opportunity to ask questions and all were answered. The patient agreed with the plan and demonstrated an understanding of the instructions.  A copy of instructions were sent to the patient via MyChart unless otherwise noted below.     The patient was advised to call back or seek an in-person evaluation if the symptoms worsen or if the condition fails to improve as anticipated.    Bari Learn, FNP

## 2024-08-08 ENCOUNTER — Encounter: Payer: Self-pay | Admitting: Physician Assistant

## 2024-08-08 ENCOUNTER — Telehealth: Admitting: Physician Assistant

## 2024-08-08 DIAGNOSIS — M545 Low back pain, unspecified: Secondary | ICD-10-CM

## 2024-08-08 MED ORDER — CYCLOBENZAPRINE HCL 5 MG PO TABS: 5.0000 mg | ORAL_TABLET | Freq: Every evening | ORAL | 0 refills | Status: AC | PRN

## 2024-08-08 MED ORDER — NAPROXEN 500 MG PO TABS: 500.0000 mg | ORAL_TABLET | Freq: Two times a day (BID) | ORAL | 0 refills | Status: AC | PRN

## 2024-08-08 NOTE — Patient Instructions (Signed)
 " Jamie Moyer, thank you for joining [name], PA-C for today's virtual visit.  While this provider is not your primary care provider (PCP), if your PCP is located in our provider database this encounter information will be shared with them immediately following your visit.   A [facility] MyChart account gives you access to today's visit and all your visits, tests, and labs performed at [facility]  click here if you don't have a [facility] MyChart account or go to [email or web address]  Consent: (Patient) Jamie Moyer provided verbal consent for this virtual visit at the beginning of the encounter.  Current Medications:  Current Outpatient Medications:    cyclobenzaprine  (FLEXERIL ) 5 MG tablet, Take 1 tablet (5 mg total) by mouth at bedtime as needed for muscle spasms., Disp: 15 tablet, Rfl: 0   naproxen  (NAPROSYN ) 500 MG tablet, Take 1 tablet (500 mg total) by mouth every 12 (twelve) hours as needed for moderate pain (pain score 4-6)., Disp: 30 tablet, Rfl: 0   fluconazole  (DIFLUCAN ) 150 MG tablet, Take 1 tablet (150 mg total) by mouth every three (3) days as needed., Disp: 2 tablet, Rfl: 0   ibuprofen  (ADVIL ) 400 MG tablet, Take 400 mg by mouth every 6 (six) hours as needed for fever, headache, mild pain, moderate pain or cramping., Disp: , Rfl:    meloxicam  (MOBIC ) 15 MG tablet, Take 1 tablet (15 mg total) by mouth daily., Disp: 15 tablet, Rfl: 0   nitrofurantoin , macrocrystal-monohydrate, (MACROBID ) 100 MG capsule, Take 1 capsule (100 mg total) by mouth 2 (two) times daily., Disp: 10 capsule, Rfl: 0   valACYclovir (VALTREX) 1000 MG tablet, Take 1 tablet by mouth as directed., Disp: , Rfl:    Medications ordered in this encounter:  Meds ordered this encounter  Medications   naproxen  (NAPROSYN ) 500 MG tablet    Sig: Take 1 tablet (500 mg total) by mouth every 12 (twelve) hours as needed for moderate pain (pain score 4-6).    Dispense:  30 tablet    Refill:   0    Supervising Provider:   LAMPTEY, PHILIP O [[id number]]   cyclobenzaprine  (FLEXERIL ) 5 MG tablet    Sig: Take 1 tablet (5 mg total) by mouth at bedtime as needed for muscle spasms.    Dispense:  15 tablet    Refill:  0    Supervising Provider:   LAMPTEY, PHILIP O [[id number]]     *If you need refills on other medications prior to your next appointment, please contact your pharmacy*  Follow-Up: Call back or seek an in-person evaluation if the symptoms worsen or if the condition fails to improve as anticipated.  [facility] [phone]  Other Instructions Apply ice alternate with moist heat Apply otc topical cream ie icy-hot, biofreeze gel, etc Perform back exercises and stretches. Take medicines as prescribed. Continue to watch for worsening symptoms. Schedule a virtual appointment or follow up at an urgent care clinic if symptoms don't improve.    If you have been instructed to have an in-person evaluation today at a local Urgent Care facility, please use the link below. It will take you to a list of all of our available [facility], including address, phone number and hours of operation. Please do not delay care.  [facility]  If you or a family member do not have a primary care provider, use the link below to schedule a visit and establish care. When you choose a  [facility] primary care physician or advanced practice provider, you gain a long-term partner in health. Find a Primary Care Provider  Learn more about [facility]'s in-office and virtual care options: [facility] - Get Care Now     "

## 2024-08-08 NOTE — Progress Notes (Signed)
 " Virtual Visit Consent   Jamie Moyer, you are scheduled for a virtual visit with a [facility] provider today. Just as with appointments in the office, your consent must be obtained to participate. Your consent will be active for this visit and any virtual visit you may have with one of our providers in the next 365 days. If you have a MyChart account, a copy of this consent can be sent to you electronically.  As this is a virtual visit, video technology does not allow for your provider to perform a traditional examination. This may limit your provider's ability to fully assess your condition. If your provider identifies any concerns that need to be evaluated in person or the need to arrange testing (such as labs, EKG, etc.), we will make arrangements to do so. Although advances in technology are sophisticated, we cannot ensure that it will always work on either your end or our end. If the connection with a video visit is poor, the visit may have to be switched to a telephone visit. With either a video or telephone visit, we are not always able to ensure that we have a secure connection.  By engaging in this virtual visit, you consent to the provision of healthcare and authorize for your insurance to be billed (if applicable) for the services provided during this visit. Depending on your insurance coverage, you may receive a charge related to this service.  I need to obtain your verbal consent now. Are you willing to proceed with your visit today? Jamie Moyer has provided verbal consent on 08/08/2024 for a virtual visit (video or telephone). [name], PA-C  Date: 08/08/2024 6:43 PM   Virtual Visit via Video Note   I, [name], connected with  Jamie Moyer  ([id number][birth year]) on 08/08/24 at  6:30 PM EST by a video-enabled telemedicine application and verified that I am speaking with the correct person using two identifiers.  Location: Patient: Virtual Visit Location Patient:  Home Provider: Virtual Visit Location Provider: Home Office   I discussed the limitations of evaluation and management by telemedicine and the availability of in person appointments. The patient expressed understanding and agreed to proceed.    History of Present Illness: Jamie Moyer is a 24 y.o. who identifies as a female who was assigned female at birth, and is being seen today for back pain.  HPI: 23y/o F presents for a telehealth video visit for c/o  back pain x 4-5 days. Denies urinary symptoms including dysuria or urinary frequency. Denies fever. 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   Back Pain    Problems:  Patient Active Problem List   Diagnosis Date Noted   Contraceptive management 09/02/2022   Encounter for annual physical exam 09/02/2022   Lightheadedness 07/01/2022   Generalized anxiety disorder 04/25/2019   Allergic rhinitis 01/26/2019   Sebaceous cyst of breast, right 09/02/2017   Breast mass, right 08/23/2017   Left leg pain 07/01/2016   Left leg numbness 07/01/2016   Migraine without aura and without status migrainosus, not intractable 11/17/2015   Anxiety 11/13/2015   IBS (irritable bowel syndrome) 11/13/2015   Chest pain 10/06/2015   GERD (gastroesophageal reflux disease) 10/06/2015   Panic attack 09/29/2015   Precordial catch syndrome 09/29/2015    Allergies: Allergies[1] Medications: Current Medications[2]  Observations/Objective: Patient is well-developed, well-nourished in no acute distress.  Resting comfortably  at home.  Head is normocephalic, atraumatic.  No labored breathing.  Speech is clear and coherent with logical content.  Patient is alert and oriented at baseline.    Assessment and Plan: 1. Acute low back pain without sciatica, unspecified back pain laterality (Primary) - naproxen  (NAPROSYN ) 500 MG tablet; Take 1 tablet (500 mg total) by mouth every 12 (twelve) hours as needed for moderate pain  (pain score 4-6).  Dispense: 30 tablet; Refill: 0 - cyclobenzaprine  (FLEXERIL ) 5 MG tablet; Take 1 tablet (5 mg total) by mouth at bedtime as needed for muscle spasms.  Dispense: 15 tablet; Refill: 0  Apply ice alternate with moist heat Apply otc topical cream ie icy-hot, biofreeze gel, etc Perform back exercises and stretches. Take medicines as prescribed. Continue to watch for worsening symptoms. Schedule a virtual appointment or follow up at an urgent care clinic if symptoms don't improve.  Pt verbalized understanding and in agreement.    Follow Up Instructions: I discussed the assessment and treatment plan with the patient. The patient was provided an opportunity to ask questions and all were answered. The patient agreed with the plan and demonstrated an understanding of the instructions.  A copy of instructions were sent to the patient via MyChart unless otherwise noted below.   Patient has requested to receive PHI (AVS, Work Notes, etc) pertaining to this video visit through e-mail as they are currently without active MyChart. They have voiced understand that email is not considered secure and their health information could be viewed by someone other than the patient.   The patient was advised to call back or seek an in-person evaluation if the symptoms worsen or if the condition fails to improve as anticipated.    Akeel Reffner, PA-C    [1]  Allergies Allergen Reactions   Cephalexin Rash  [2]  Current Outpatient Medications:    cyclobenzaprine  (FLEXERIL ) 5 MG tablet, Take 1 tablet (5 mg total) by mouth at bedtime as needed for muscle spasms., Disp: 15 tablet, Rfl: 0   naproxen  (NAPROSYN ) 500 MG tablet, Take 1 tablet (500 mg total) by mouth every 12 (twelve) hours as needed for moderate pain (pain score 4-6)., Disp: 30 tablet, Rfl: 0   fluconazole  (DIFLUCAN ) 150 MG tablet, Take 1 tablet (150 mg total) by mouth every three (3) days as needed., Disp: 2 tablet, Rfl: 0   ibuprofen   (ADVIL ) 400 MG tablet, Take 400 mg by mouth every 6 (six) hours as needed for fever, headache, mild pain, moderate pain or cramping., Disp: , Rfl:    meloxicam  (MOBIC ) 15 MG tablet, Take 1 tablet (15 mg total) by mouth daily., Disp: 15 tablet, Rfl: 0   nitrofurantoin , macrocrystal-monohydrate, (MACROBID ) 100 MG capsule, Take 1 capsule (100 mg total) by mouth 2 (two) times daily., Disp: 10 capsule, Rfl: 0   valACYclovir (VALTREX) 1000 MG tablet, Take 1 tablet by mouth as directed., Disp: , Rfl:   "
